# Patient Record
Sex: Female | Born: 1960 | Race: White | Hispanic: No | Marital: Single | Smoking: Never smoker
Health system: Southern US, Community
[De-identification: ages and names within clinical notes are randomized; demographics above are authoritative.]

## PROBLEM LIST (undated history)

## (undated) DIAGNOSIS — G43909 Migraine, unspecified, not intractable, without status migrainosus: Secondary | ICD-10-CM

## (undated) DIAGNOSIS — M1990 Unspecified osteoarthritis, unspecified site: Secondary | ICD-10-CM

---

## 2016-06-25 DIAGNOSIS — G43909 Migraine, unspecified, not intractable, without status migrainosus: Secondary | ICD-10-CM | POA: Insufficient documentation

## 2020-11-16 ENCOUNTER — Encounter (HOSPITAL_COMMUNITY): Payer: Self-pay

## 2020-11-16 ENCOUNTER — Emergency Department (HOSPITAL_COMMUNITY): Payer: Medicare PPO

## 2020-11-16 ENCOUNTER — Emergency Department (HOSPITAL_COMMUNITY)
Admission: EM | Admit: 2020-11-16 | Discharge: 2020-11-17 | Disposition: A | Discharge status: Home / Self Care | Payer: Medicare PPO | Attending: Emergency Medicine | Admitting: Emergency Medicine

## 2020-11-16 DIAGNOSIS — S80211A Abrasion, right knee, initial encounter: Secondary | ICD-10-CM | POA: Diagnosis not present

## 2020-11-16 DIAGNOSIS — W000XXA Fall on same level due to ice and snow, initial encounter: Secondary | ICD-10-CM | POA: Insufficient documentation

## 2020-11-16 DIAGNOSIS — S8991XA Unspecified injury of right lower leg, initial encounter: Secondary | ICD-10-CM | POA: Diagnosis present

## 2020-11-16 DIAGNOSIS — M79642 Pain in left hand: Secondary | ICD-10-CM | POA: Diagnosis not present

## 2020-11-16 IMAGING — DX DG KNEE COMPLETE 4+V*R*
4 series · 4 of 4 positions shown · non-contrast
Comparison: None.

CLINICAL DATA: Fall, pain

EXAM:
RIGHT KNEE - COMPLETE 4+ VIEW

[knee ap]
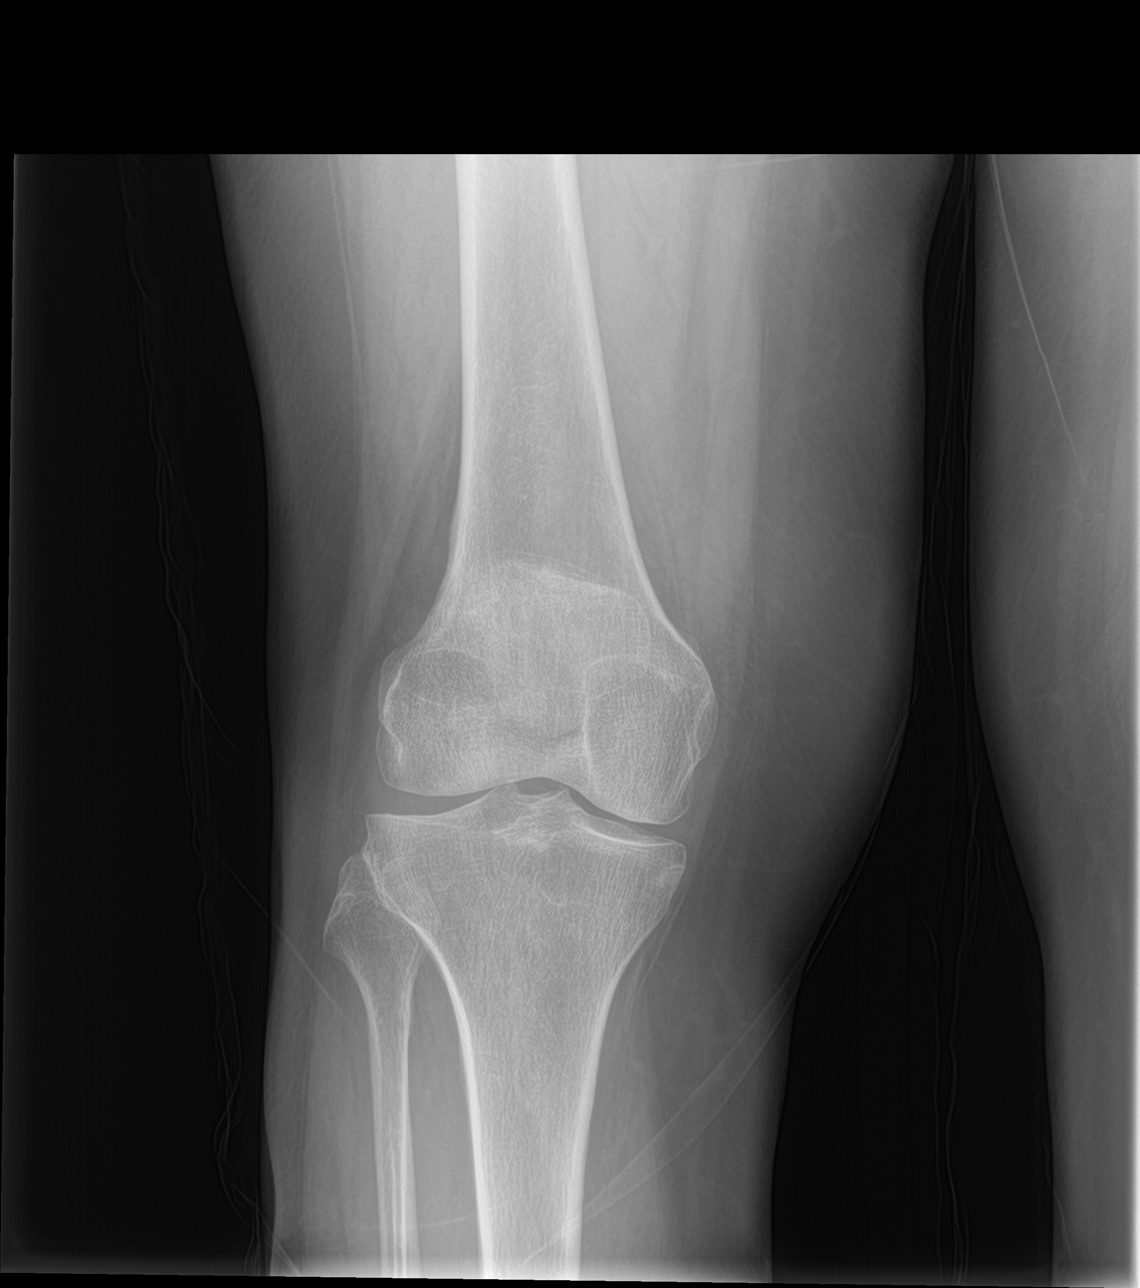

[knee lat]
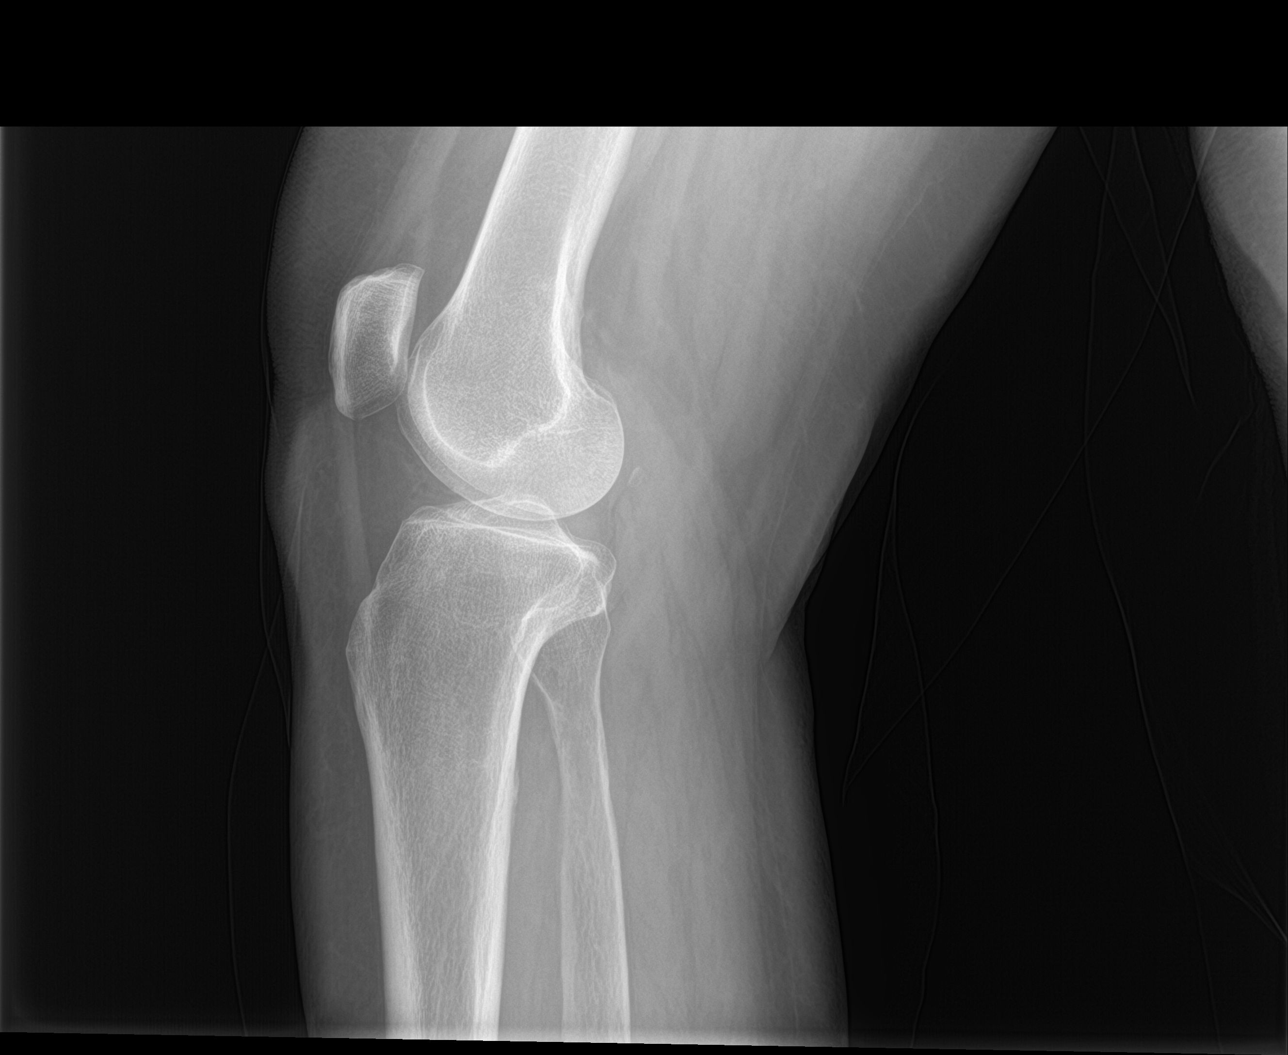

[knee obl (1 of 2)]
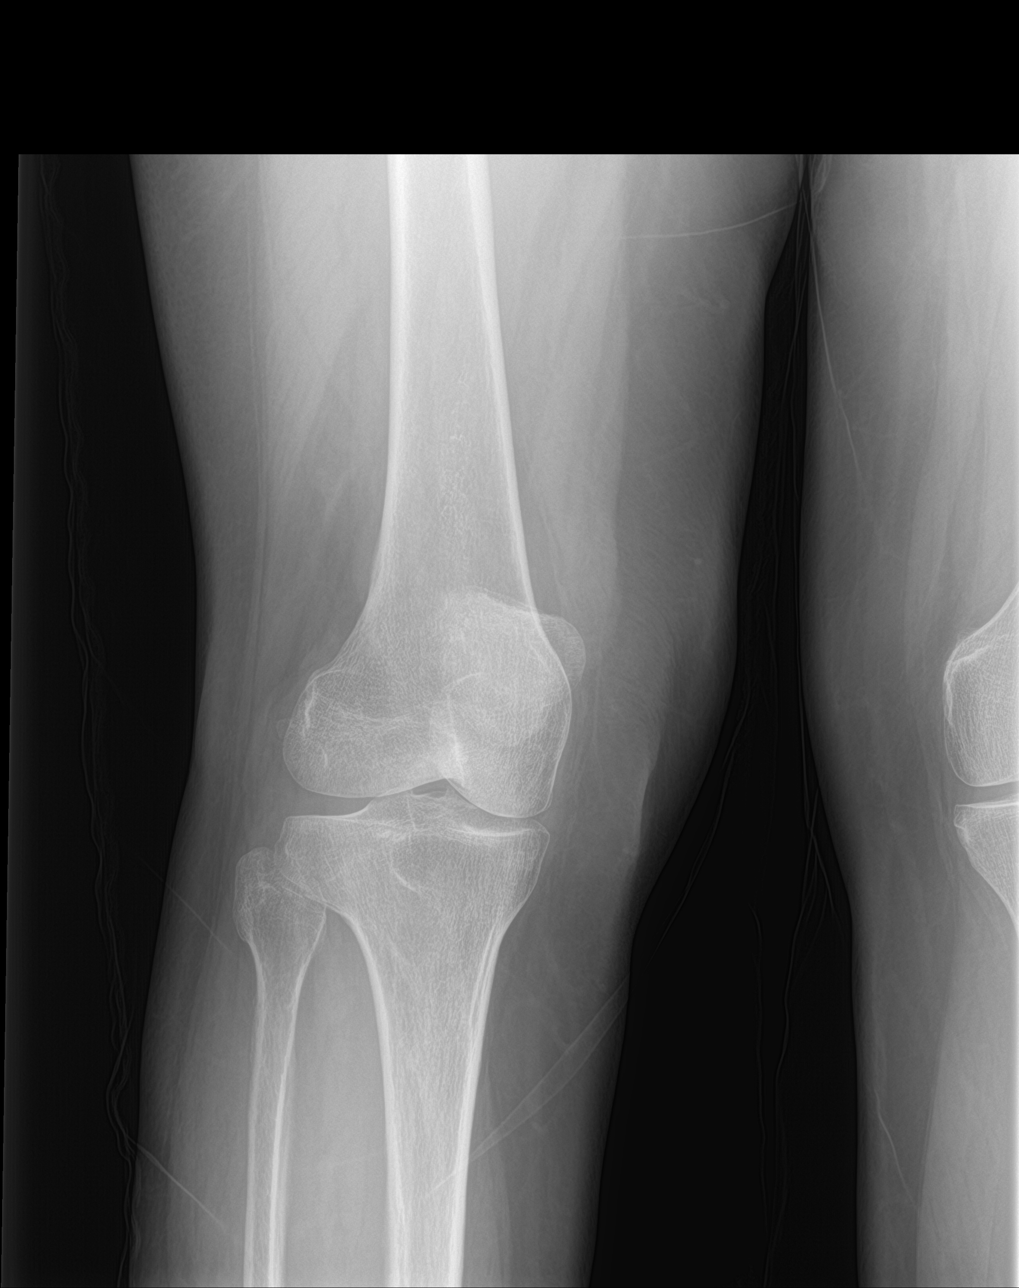

[knee obl (2 of 2)]
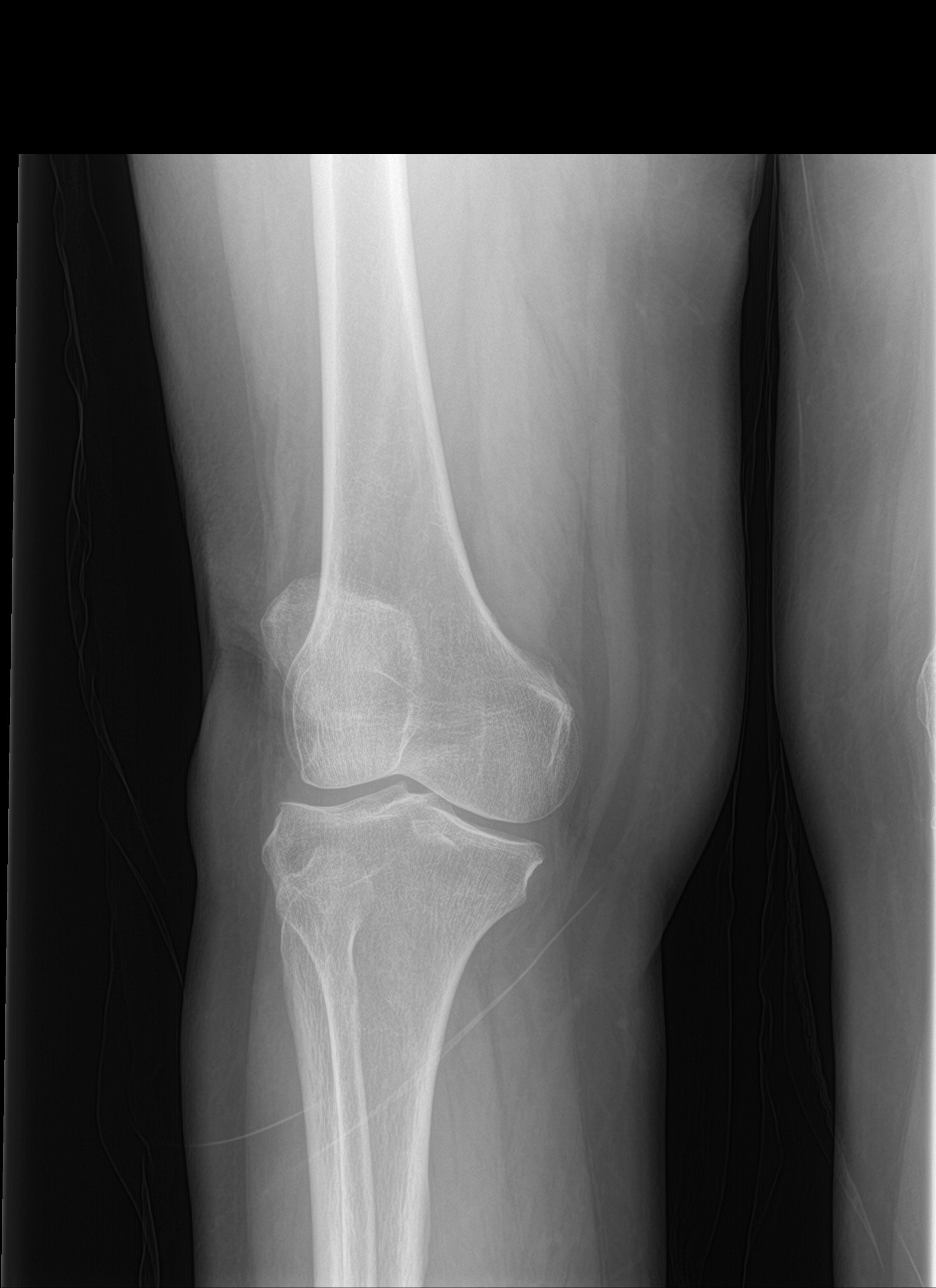

[4 of 4 positions shown; findings below may reference images not displayed]

FINDINGS: No evidence of fracture, dislocation, or joint effusion. No evidence
of arthropathy or other focal bone abnormality. Soft tissues are
unremarkable.
IMPRESSION: Negative.

## 2020-11-16 IMAGING — DX DG HAND COMPLETE 3+V*L*
3 series · 3 of 3 positions shown · non-contrast
Comparison: None.

CLINICAL DATA: Status post fall

EXAM:
LEFT HAND - COMPLETE 3+ VIEW

[hand pa]
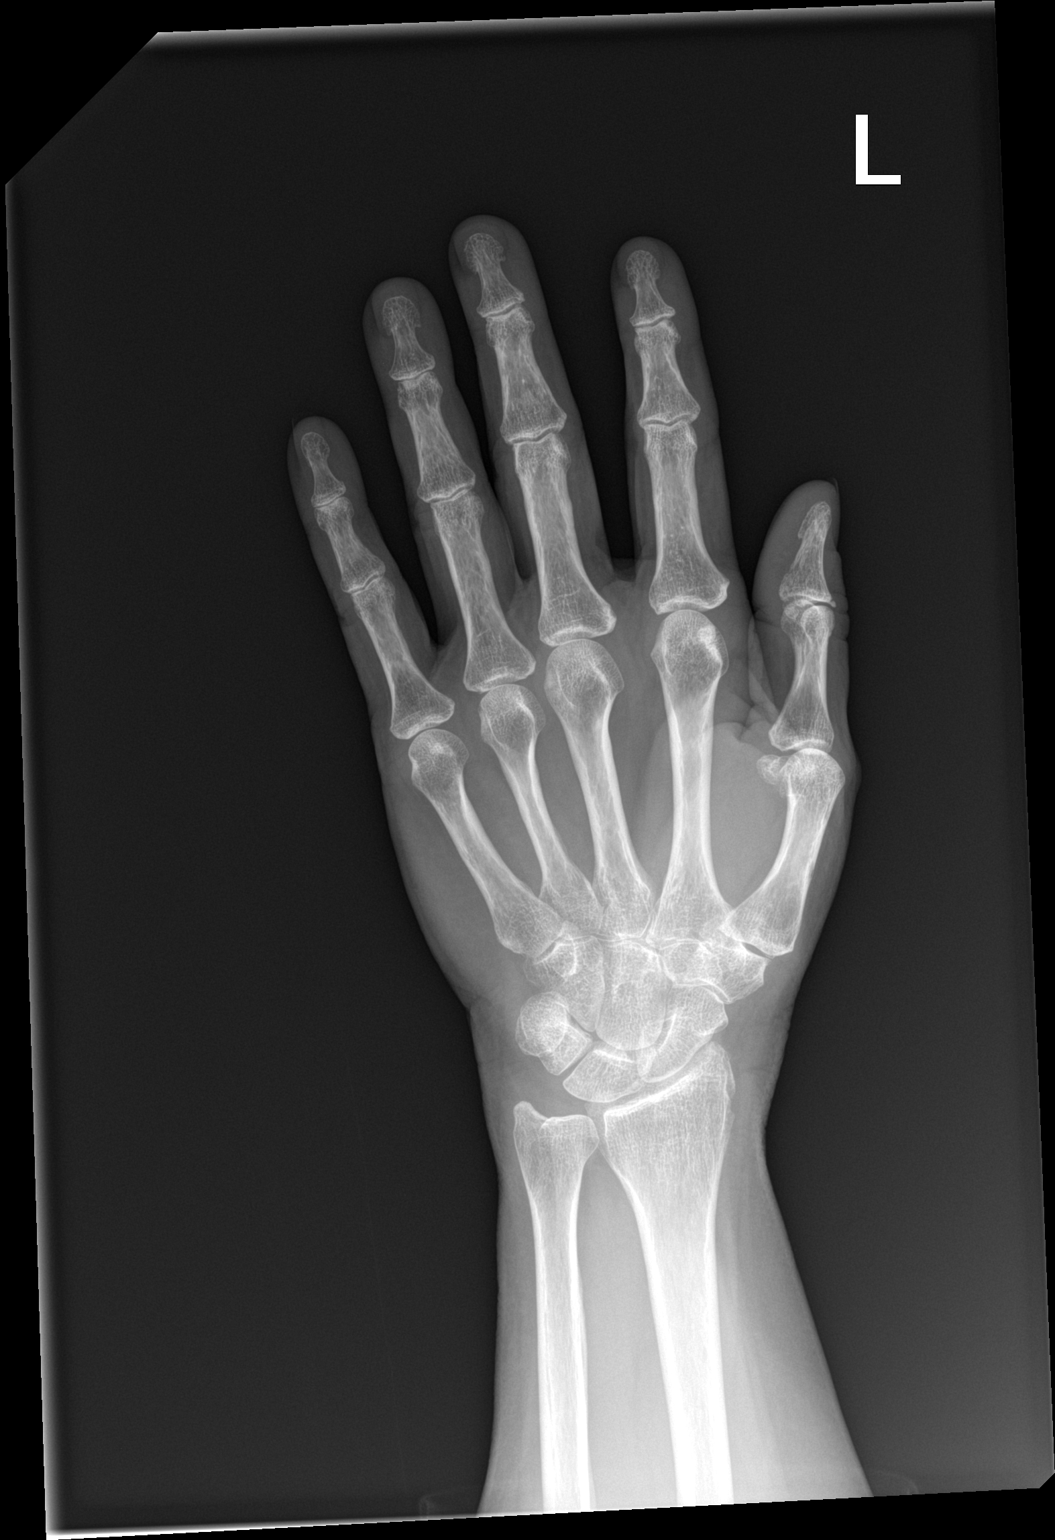

[hand obl]
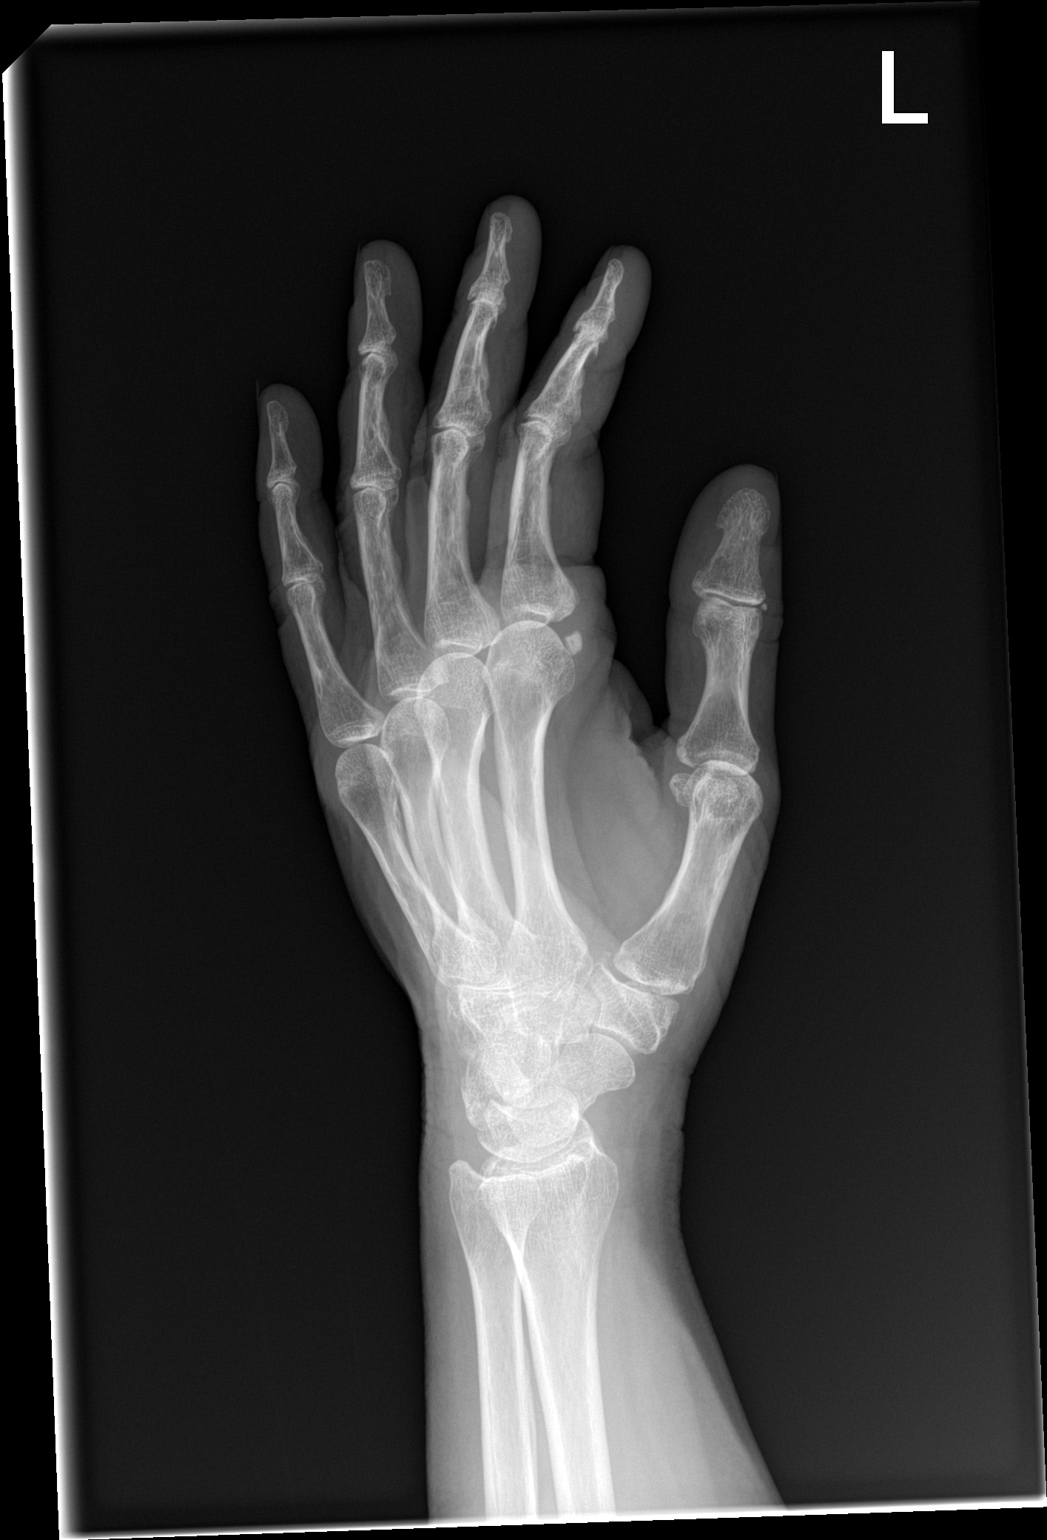

[hand lat]
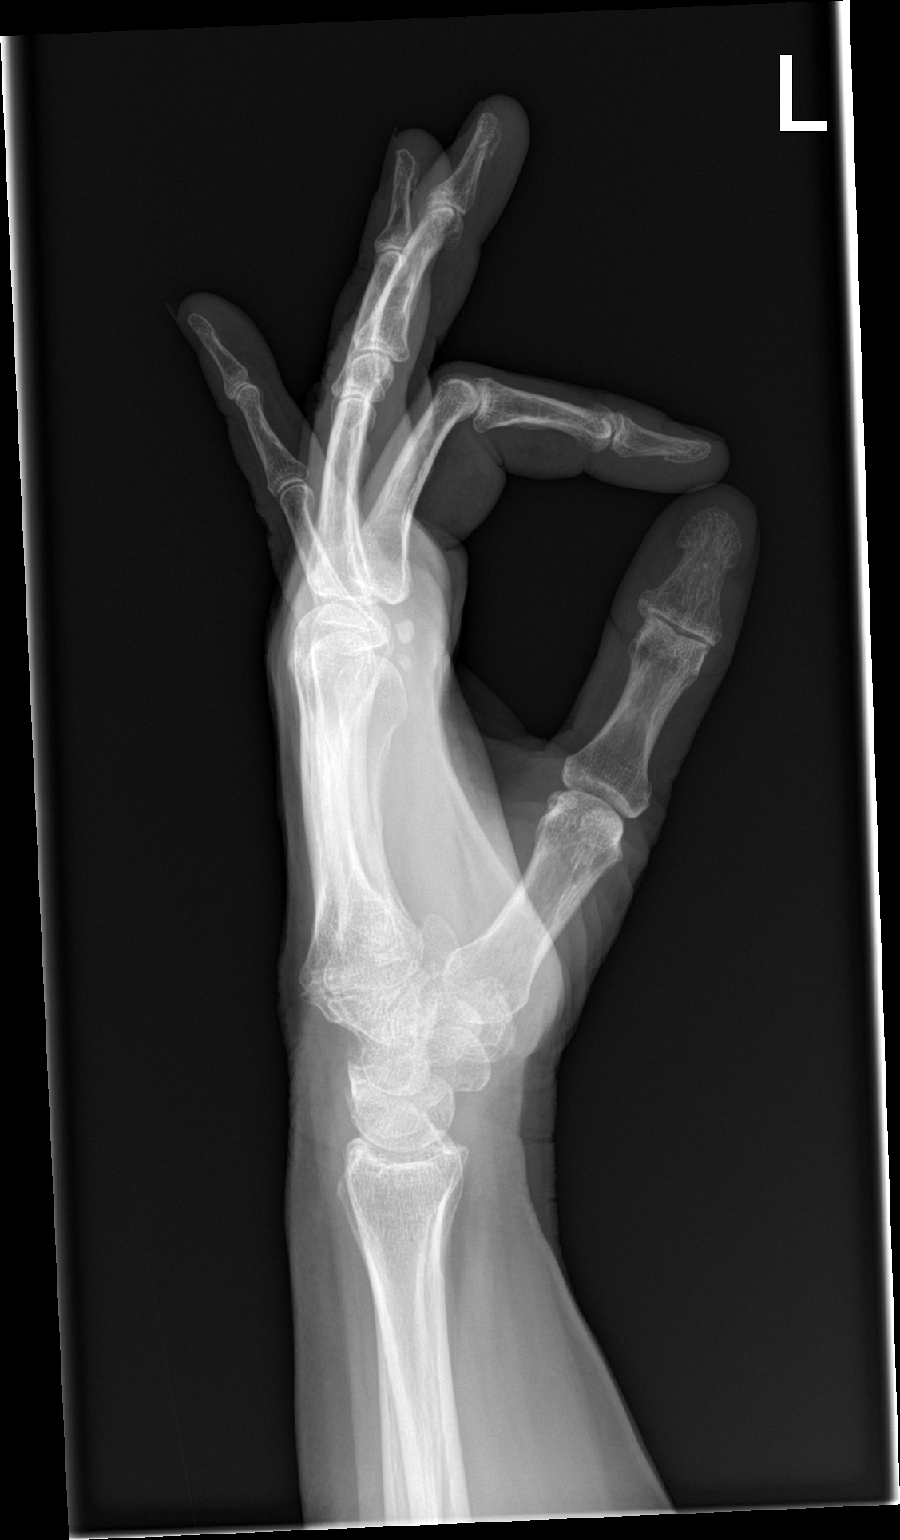

[3 of 3 positions shown; findings below may reference images not displayed]

FINDINGS: There is no evidence of fracture or dislocation. Mild
interphalangeal joint degenerative changes. Mild first
carpometacarpal joint degenerative changes. There is no evidence of
arthropathy or other focal bone abnormality. Soft tissues are
unremarkable.
IMPRESSION: No acute displaced fracture or dislocation.

## 2020-11-16 MED ORDER — OXYCODONE-ACETAMINOPHEN 5-325 MG PO TABS
1.0000 | ORAL_TABLET | Freq: Once | ORAL | Status: AC
  Administered 2020-11-16: 1 via ORAL
  Filled 2020-11-16: qty 1

## 2020-11-16 MED ORDER — KETOROLAC TROMETHAMINE 60 MG/2ML IM SOLN
30.0000 mg | Freq: Once | INTRAMUSCULAR | Status: AC
  Administered 2020-11-16: 30 mg via INTRAMUSCULAR
  Filled 2020-11-16: qty 2

## 2020-11-16 NOTE — Progress Notes (Signed)
Orthopedic Tech Progress Note Patient Details:  Aimee Bruce 13-Nov-1960 [id number]  Ortho Devices Type of Ortho Device: Thumb spica splint Splint Material: Other (comment) Ortho Device/Splint Interventions: Application   Post Interventions Patient Tolerated: Well   Aimee Bruce 11/16/2020, 11:56 PM

## 2020-11-16 NOTE — ED Triage Notes (Addendum)
Pt BIB EMS from home due to fall.Pt reports she fell on ice . Pt c/o left hand pain.Pt denies hitting head. Pt is deaf.

## 2020-11-17 NOTE — ED Provider Notes (Signed)
[facility] EMERGENCY DEPARTMENT Provider Note   CSN: [id number] Arrival date & time: 11/16/20  1444     History Chief Complaint  Patient presents with   Hand Pain    Aimee Bruce is a 60 y.o. female.  60 year old female who is deaf and speaks and language presents emerged part after a fall.  She says she is walking to the parking lot slipped on some ice fell forward hit her head and landed on her left hand which ultimately bent her thumb back at a weird angle and then also scraped her right knee.  She presents here for further evaluation.  At this time, 9 hours into her stay, and her head does not hurt.  She has mild abrasions to her right knee and still has some left wrist pain.  No injuries elsewhere.  No loss of conscious.  No vomiting.  No severe headache. No neuro changes.   A language interpreter was used.  Hand Pain       History reviewed. No pertinent past medical history.  There are no problems to display for this patient.   History reviewed. No pertinent surgical history.   OB History   No obstetric history on file.     History reviewed. No pertinent family history.  Social History   Tobacco Use   Smoking status: Never Smoker   Smokeless tobacco: Never Used    Home Medications Prior to Admission medications   Not on File    Allergies    Patient has no allergy information on record.  Review of Systems   Review of Systems  All other systems reviewed and are negative.   Physical Exam Updated Vital Signs BP 122/74    Pulse 81    Temp 98.3 F (36.8 C) (Oral)    Resp 18    SpO2 97%   Physical Exam Vitals and nursing note reviewed.  Constitutional:      Appearance: She is well-developed and well-nourished.  HENT:     Head: Normocephalic and atraumatic.     Mouth/Throat:     Mouth: Mucous membranes are moist.     Pharynx: Oropharynx is clear.  Eyes:     Pupils: Pupils are equal, round, and reactive to light.   Cardiovascular:     Rate and Rhythm: Normal rate and regular rhythm.  Pulmonary:     Effort: No respiratory distress.     Breath sounds: No stridor.  Abdominal:     General: There is no distension.  Musculoskeletal:        General: Tenderness (left snuffbox) present.     Cervical back: Normal range of motion.  Skin:    General: Skin is warm and dry.     Comments: Abrasions to right knee  Neurological:     General: No focal deficit present.     Mental Status: She is alert.     ED Results / Procedures / Treatments   Labs (all labs ordered are listed, but only abnormal results are displayed) Labs Reviewed - No data to display  EKG None  Radiology DG Knee Complete 4 Views Right  Result Date: 11/16/2020 CLINICAL DATA:  Fall, pain EXAM: RIGHT KNEE - COMPLETE 4+ VIEW COMPARISON:  None. FINDINGS: No evidence of fracture, dislocation, or joint effusion. No evidence of arthropathy or other focal bone abnormality. Soft tissues are unremarkable. IMPRESSION: Negative. Electronically Signed   By: Jonna Clark M.D.   On: 11/16/2020 19:55   DG Hand Complete Left  Result Date: 11/16/2020 CLINICAL DATA:  Status post fall EXAM: LEFT HAND - COMPLETE 3+ VIEW COMPARISON:  None. FINDINGS: There is no evidence of fracture or dislocation. Mild interphalangeal joint degenerative changes. Mild first carpometacarpal joint degenerative changes. There is no evidence of arthropathy or other focal bone abnormality. Soft tissues are unremarkable. IMPRESSION: No acute displaced fracture or dislocation. Electronically Signed   By: Tish Frederickson M.D.   On: 11/16/2020 19:55    Procedures Procedures (including critical care time)  Medications Ordered in ED Medications  ketorolac (TORADOL) injection 30 mg (30 mg Intramuscular Given 11/16/20 2332)  oxyCODONE-acetaminophen (PERCOCET/ROXICET) 5-325 MG per tablet 1 tablet (1 tablet Oral Given 11/16/20 2332)    ED Course  I have reviewed the triage vital signs  and the nursing notes.  Pertinent labs & imaging results that were available during my care of the patient were reviewed by me and considered in my medical decision making (see chart for details).    MDM Rules/Calculators/A&P                          No obvious injuries on x-rays.  She could have an occults scaphoid fracture so we will put in a thumb spica and have her follow-up with PCP for repeat imaging in a week if she still having discomfort.  Knee abrasions to be treated at home.  Low suspicion for intracranial injury secondary to no headache or neuro symptoms or other evidence of severe trauma to her head.  We will treat symptomatically here DC on Tylenol and ibuprofen.  Final Clinical Impression(s) / ED Diagnoses Final diagnoses:  Left hand pain    Rx / DC Orders ED Discharge Orders    None       Asuncion Tapscott, Barbara Cower, MD 11/17/20 [phone]

## 2021-01-01 ENCOUNTER — Ambulatory Visit: Payer: Medicare PPO | Admitting: Family Medicine

## 2021-01-08 ENCOUNTER — Ambulatory Visit: Payer: Self-pay

## 2021-01-08 ENCOUNTER — Encounter: Payer: Self-pay | Admitting: Family Medicine

## 2021-01-08 ENCOUNTER — Other Ambulatory Visit: Payer: Self-pay

## 2021-01-08 ENCOUNTER — Ambulatory Visit (INDEPENDENT_AMBULATORY_CARE_PROVIDER_SITE_OTHER): Payer: Medicare Other | Admitting: Family Medicine

## 2021-01-08 DIAGNOSIS — M545 Low back pain, unspecified: Secondary | ICD-10-CM

## 2021-01-08 DIAGNOSIS — G8929 Other chronic pain: Secondary | ICD-10-CM

## 2021-01-08 DIAGNOSIS — M25532 Pain in left wrist: Secondary | ICD-10-CM

## 2021-01-08 MED ORDER — OXYCODONE-ACETAMINOPHEN 5-325 MG PO TABS: 1.0000 | ORAL_TABLET | Freq: Three times a day (TID) | ORAL | 0 refills | Status: DC | PRN

## 2021-01-08 NOTE — Progress Notes (Signed)
I saw and examined the patient with Dr. Marga Hoots and agree with assessment and plan as outlined.    Ongoing left wrist pain more than 6 weeks since falling.  Still very tender in anatomic snuffbox and at thumb CMC.  X-Rays today are equivocal.  Will order MRI followed by hand surgery consult if fracture confirmed, for possible ORIF.  She is deaf and relies heavily on hands for communication.  Also chronic LBP.  Previously did well with injections, and requests referral for that.

## 2021-01-08 NOTE — Progress Notes (Signed)
Office Visit Note   Patient: Aimee Bruce           Date of Birth: [birth year]           MRN: [id number] Visit Date: 01/08/2021 Requested by: No referring provider defined for this encounter. PCP: Patient, No Pcp Per  Subjective: Chief Complaint  Patient presents with  . Lower Back - Pain  . Left Wrist - Pain, Follow-up    HPI: 59yo RHD F presenting to clinic with concerns of left wrist pain as well as dorsalgia. Patient states that she slipped on ice and landed on her left wrist, causing immediate, severe pain. She struggled to get up, and had to call 911 for assistance. In the hospital, she was told she 'pulled' her wrist, and was given a thumb spica splint. She continues to endorse severe pain throughout the radial aspect of her wrist, which is significantly limiting her ability to communicate with sign language. She also feels as though her wrist is swollen, and has a bony bump that isn't present on the right. She was told in the ED to follow up with her PCM for repeated Xrays, but wasn't able to do so.  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 This has done very well in controlling her symptoms, and she is interested in having a similar procedure done again if possible.                ROS:   All other systems were reviewed and are negative.  Objective: Vital Signs: There were no vitals taken for this visit.  Physical Exam:  General:  Alert and oriented, in no acute distress. Pulm:  Breathing unlabored. Psy:  Normal mood, congruent affect. Skin:  Left wrist with no bruising, rashes, or erythema. Overlying skin intact.  Left Wrist Exam:  No obvious swelling, deformity or discoloration.  ROM: Full ROm in wrist, however endorses pain with radial deviation. All fingers with full ROM.  Normal MCP/PIP/DIP joint  flexion and extension of all fingers. No finger rotational abnormality.   Strength: No pain with resisted extension or flexion of the wrist. Dos endorse pain with resisted pronation/supination, as well as resisted abduction of thumb.  Normal Grip strength.  Normal finger abduction and adduction, and normal flexor superficialis (PIP) and Profundus strength (DIP).   Palpation: Endorses significant Snuff Box tenderness. There is also tenderness over the [facility] Joint, and along the first dorsal wrist compartment, but this is less significant than over snuff box.  No pain with compression over the lunate and triquetrum. No pain with palpation in TFCC area. No pain over distal radius or ulna.   Carpal Special Tests:  Watson's Test (Scaphoid shift test): Endorses pain, though no obvious click at scaphoid with radial deviation Reagan Test (Luno-triquetral ballotment test): No pain or clicking Schuck Test: No pain or increased laxity within carpals  DeQuervain's Tenosynovitis- Finklestein's test does cause pain.   Sensation intact throughout all fingers Brisk capillary refill.   Back Exam: Normal gait, normal spinal curvature. Does have tenderness throughout paraspinal muscles throughout the thoracic and lumbar spine, as well as peri-scapular trigger points within the rhomboids/inferior traps.   Imaging: No results found.  Assessment & Plan: 60yo F presenting to clinic with concerns of left wrist pain after a fall approximately 6 weeks ago. Patient continues to have  significant pain in her left scaphoid, and xrays today with questionable callous formation, possibly indicative of a small fracture within the scaphoid.  - Given questionable Xrays, will order MRI to better evaluate wrist pain. - Continue to wear thumb spica splint while awaiting MRI results - If positive, would place referral to hand team  Per her back, given her success with spinal injections previously, will place referral to Dr  Alvester Morin for consideration of the same.   - Patient expresses understanding with plan. She has no further questions or concerns today.      Procedures: No procedures performed        PMFS History: There are no problems to display for this patient.  History reviewed. No pertinent past medical history.  History reviewed. No pertinent family history.  History reviewed. No pertinent surgical history. Social History   Occupational History  . Not on file  Tobacco Use  . Smoking status: Never Smoker  . Smokeless tobacco: Never Used  Substance and Sexual Activity  . Alcohol use: Not on file  . Drug use: Not on file  . Sexual activity: Not on file

## 2021-01-15 ENCOUNTER — Other Ambulatory Visit: Payer: Self-pay

## 2021-01-15 ENCOUNTER — Ambulatory Visit (INDEPENDENT_AMBULATORY_CARE_PROVIDER_SITE_OTHER): Payer: Medicare Other | Admitting: Family Medicine

## 2021-01-15 ENCOUNTER — Encounter: Payer: Self-pay | Admitting: Family Medicine

## 2021-01-15 DIAGNOSIS — M545 Low back pain, unspecified: Secondary | ICD-10-CM

## 2021-01-15 DIAGNOSIS — M25561 Pain in right knee: Secondary | ICD-10-CM | POA: Diagnosis not present

## 2021-01-15 DIAGNOSIS — G8929 Other chronic pain: Secondary | ICD-10-CM

## 2021-01-15 DIAGNOSIS — G894 Chronic pain syndrome: Secondary | ICD-10-CM

## 2021-01-15 NOTE — Patient Instructions (Signed)
   For arthritis:   Glucosamine Sulfate 1,000 mg twice daily  Turmeric:  500 mg twice daily    

## 2021-01-15 NOTE — Progress Notes (Signed)
Office Visit Note   Patient: Aimee Bruce           Date of Birth: 05-Sep-1961           MRN: [id number] Visit Date: 01/15/2021 Requested by: No referring provider defined for this encounter. PCP: Patient, No Pcp Per  Subjective: Chief Complaint  Patient presents with  . Right Knee - Pain    Intermittent lateral knee pain x 1 year. NKI. Hurts with driving and getting in/out of the car. The patient feels a "knot" in that area. The knee aches at night - hurts with moving it at night. Had xrays in January. She said she would like to have a "full body" MRI. She has an MRI of her left wrist coming up on 02/02/21 at [facility].     HPI: 60 year old female presenting to clinic with concerns of chronic right knee pain.  Patient states her right knee has been bothering her off and on for about the last 2 years, however it acutely worsened 2 months ago when she slipped and fell directly on the front of her knee.  She was seen in the ER, where x-rays were negative for fractures, but she was told that she had mild arthritis.  Since this time she endorses pain with turning on the leg, specifically getting in and out of her car, and feels as though it will occasionally swell.  No catching or locking, no feelings of instability.  She is curious to know if an injection might help improve her pain.  She takes Percocet for other musculoskeletal concerns, and says that this also improved her knee pain.  She says she has a strong family history of bilateral knee replacements and her mother, and is very worried about falling and this footsteps.  She denies doing any regular exercises to strengthen her knee.  Sign language interpreter present throughout visit to assist with communication.              ROS:   All other systems were reviewed and are negative.  Objective: Vital Signs: There were no vitals taken for this visit.  Physical Exam:  General:  Alert and oriented, in no acute distress. Pulm:   Breathing unlabored. Psy:  Normal mood, congruent affect. Skin: Bilateral knees with no bruising, rashes, or erythema.  Overlying skin intact.  Right knee exam:  General: Normal gait Standing exam: No varus or valgus deformity of the knee.   Seated Exam:  No significant crepitus, Negative J-Sign.   Palpation: Endorses significant tenderness palpation over both medial and lateral joint lines bilaterally, right worse than left.  Also endorses tenderness with patellar compression on the right, as well as within the patellar tendon.  Tenderness to palpation of both medial and lateral patellar facets on the right.  Tenderness within the posterior aspect of the knee, though no obvious fullness appreciated.   Supine exam: No effusion, normal patellar mobility.   Ligamentous Exam:  Endorses pain but no laxity with anterior/posterior drawer.  No obvious Sag.  Endorses pain though has strong endpoints with varus/valgus stress across the knee.   Meniscus:  McMurray with no pain or deep clicking.   Strength: Hip flexion (L1), Hip Aduction (L2), Knee Extension (L3) are 5/5 Bilaterally Foot Inversion (L4), Dorsiflexion (L5), and Eversion (S1) 5/5 Bilaterally  Sensation: Intact to light touch medial and lateral aspects of lower extremities, and lateral, dorsal, and medial aspects of foot.    Imaging: No results found.  Assessment & Plan:  60 year old female presenting to clinic today with chronic right knee pain, acutely worsened after a fall 2 months ago.  Examination with tenderness throughout the right knee, though no obvious effusions appreciated.  X-rays from the emergency department reviewed, which do demonstrate very mild degenerative changes, which may be contributing to her symptoms.  Patient would like to try injection therapy today.  Risks and benefits discussed, and patient opted to proceed. -Injection therapy described as below.  Patient tolerated this very well, with no acute  complications.  She endorsed immediate improvement of symptoms during the anesthetic phase. -Patient requests refill of Percocet, we discussed that this is not a safe long-term option for her pain control.  Will place referral to pain management for consideration of chronic opiate therapy or, more preferably, other means of controlling her symptoms. -Discussed home exercises for knee stabilization and strengthening. -Patient expressed understanding with plan.  She no further questions or concerns today.     Procedures: Right knee cortisone Injection:  Risks and benefits of procedure discussed, Patient opted to proceed.  Verbal consent obtained.  Timeout performed.  Skin prepped in a sterile fashion with betadine before further cleansing with alcohol. Ethyl Chloride was used for topical analgesia.  Right knee was injected with 4cc 0.25% bupivacaine without epinephrine via the midpatellar approach using a 25G, 1.5in needle. Syringe was removed from the needle, and 6mg  betamethasone was then injected into the joint.    Patient tolerated the injection well with no immediate complications. Aftercare instructions were discussed, and patient was given strict return precautions.      PMFS History: Patient Active Problem List   Diagnosis Date Noted  . Migraine 06/25/2016   History reviewed. No pertinent past medical history.  History reviewed. No pertinent family history.  History reviewed. No pertinent surgical history. Social History   Occupational History  . Not on file  Tobacco Use  . Smoking status: Never Smoker  . Smokeless tobacco: Never Used  Substance and Sexual Activity  . Alcohol use: Not on file  . Drug use: Not on file  . Sexual activity: Not on file

## 2021-01-15 NOTE — Progress Notes (Signed)
I saw and examined the patient with Dr. Marga Hoots and agree with assessment and plan as outlined.    Chronic right knee pain with mild arthritis on x-ray.  Injection given today.  Also home exercises.  Suggested glucosamine, turmeric, avoidance of sugar, and weight loss.  Patient wants percocet long-term.  Will request pain clinic referral.

## 2021-02-01 ENCOUNTER — Encounter: Payer: Self-pay | Admitting: Physical Medicine & Rehabilitation

## 2021-02-02 ENCOUNTER — Other Ambulatory Visit: Payer: Self-pay

## 2021-02-02 ENCOUNTER — Ambulatory Visit
Admission: RE | Admit: 2021-02-02 | Discharge: 2021-02-02 | Disposition: A | Discharge status: Home / Self Care | Payer: Medicare Other | Source: Ambulatory Visit | Attending: Family Medicine | Admitting: Family Medicine

## 2021-02-02 DIAGNOSIS — M25532 Pain in left wrist: Secondary | ICD-10-CM

## 2021-02-02 IMAGING — MR MR WRIST*L* W/O CM
9 series · 40 of 40 positions shown · non-contrast
Comparison: None.

CLINICAL DATA: Left wrist pain. Status post fall.

EXAM:
MR OF THE LEFT WRIST WITHOUT CONTRAST
TECHNIQUE: Multiplanar, multisequence MR imaging of the left wrist was
performed. No intravenous contrast was administered.

[Series 3: T2 fat-sat · axial · left · 3.0mm · 0.31mm/px · z∈[-48,+38]mm · 7 of 28 slices shown (1 of 2)]
[im 1/28]
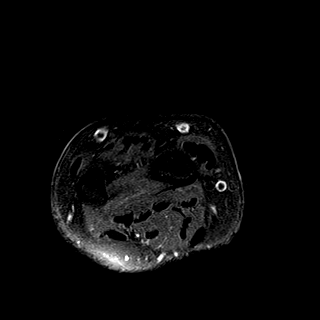
[im 5/28]
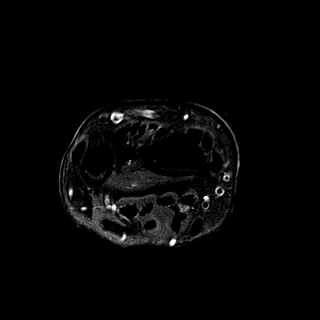
[im 10/28]
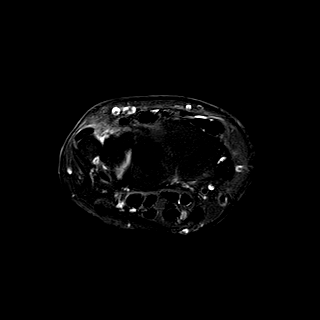
[im 14/28]
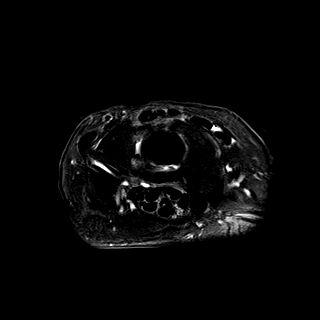
[im 19/28]
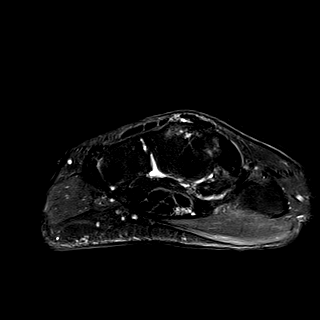
[im 23/28]
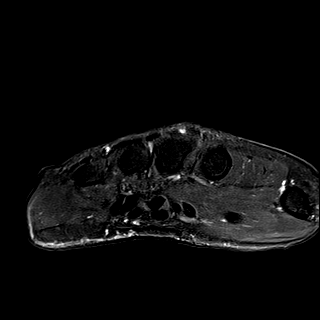
[im 28/28]
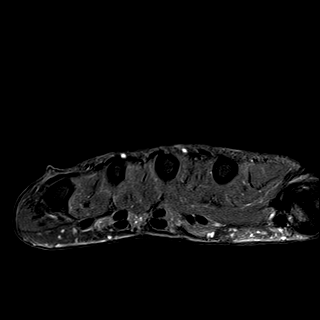

[Series 4: T1 · axial · left · 3.0mm · 0.31mm/px · z∈[-48,+38]mm · 6 of 28 slices shown (1 of 2)]
[im 1/28]
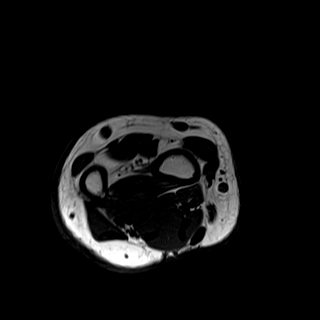
[im 6/28]
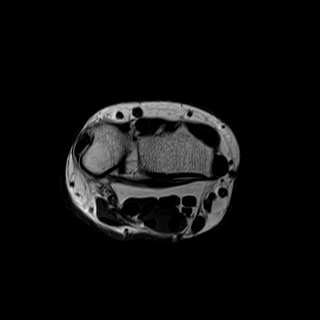
[im 11/28]
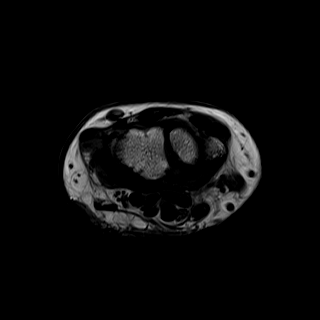
[im 17/28]
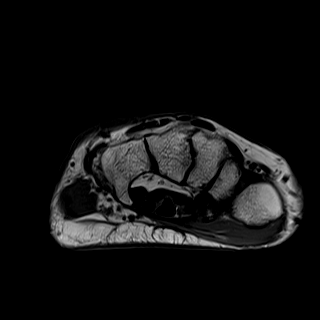
[im 22/28]
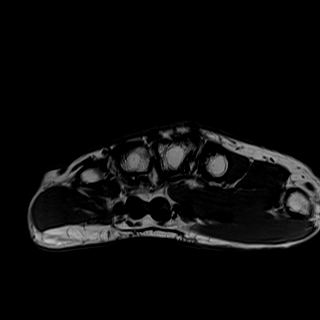
[im 28/28]
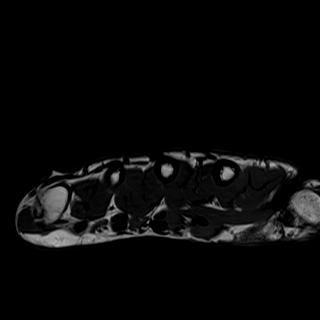

[Series 5: T1 · coronal · left · 3.0mm · 0.31mm/px · 3 of 13 slices shown (2 of 2)]
[im 1/13]
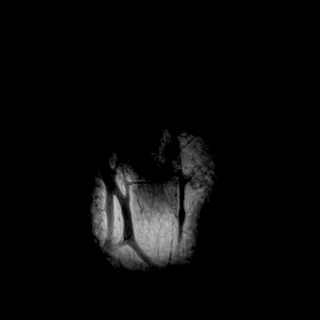
[im 7/13]
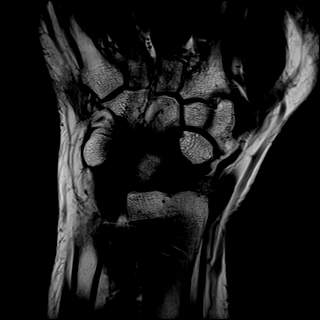
[im 13/13]
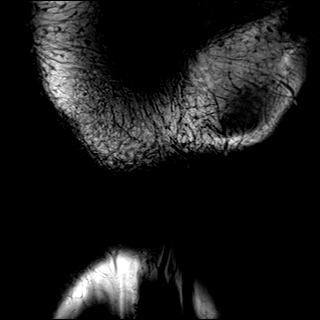

[Series 6: PD · axial · left · 3.0mm · 0.31mm/px · z∈[-48,+38]mm · 6 of 28 slices shown]
[im 1/28]
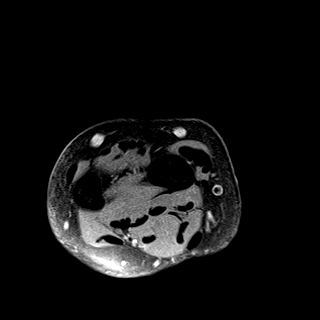
[im 6/28]
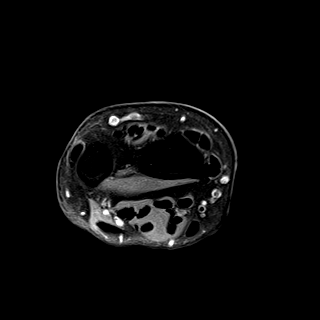
[im 11/28]
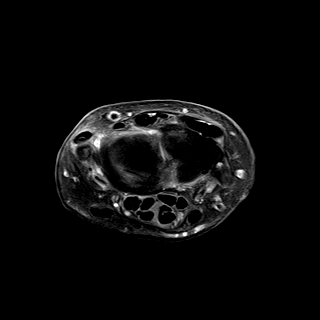
[im 17/28]
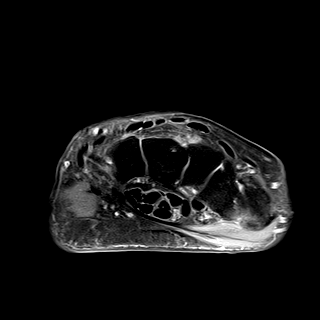
[im 22/28]
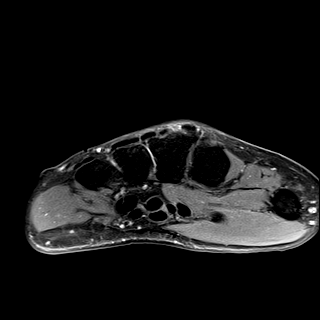
[im 28/28]
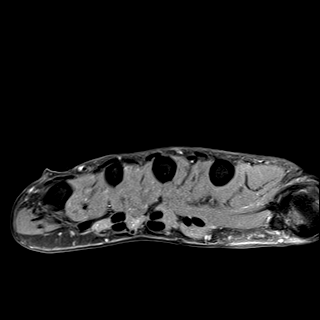

[Series 7: T2 fat-sat · coronal · left · 3.0mm · 0.31mm/px · 3 of 13 slices shown (2 of 2)]
[im 1/13]
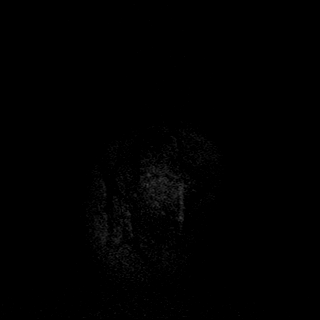
[im 7/13]
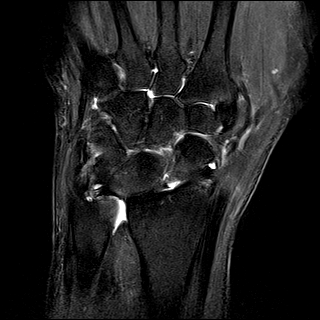
[im 13/13]
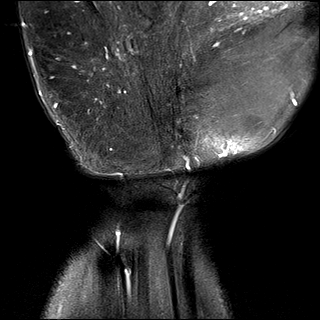

[Series 8: PD fat-sat · coronal · left · 3.0mm · 0.31mm/px · 3 of 13 slices shown (1 of 2)]
[im 1/13]
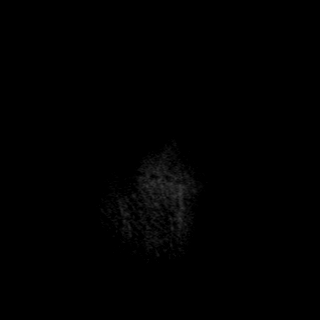
[im 7/13]
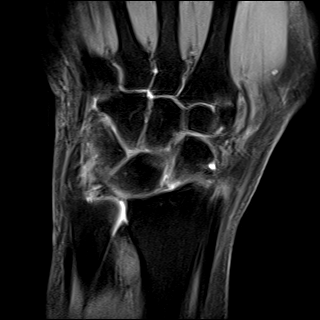
[im 13/13]
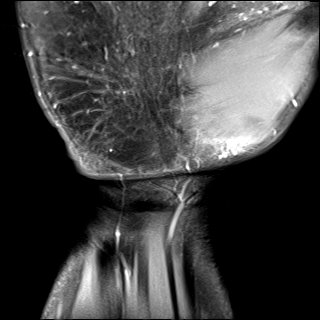

[Series 9: PD fat-sat · sagittal · left · 3.0mm · 0.44mm/px · 6 of 24 slices shown (2 of 2)]
[im 1/24]
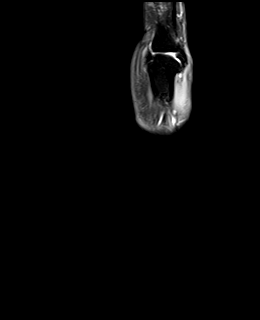
[im 5/24]
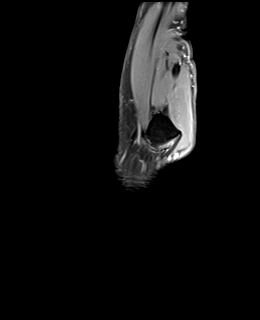
[im 10/24]
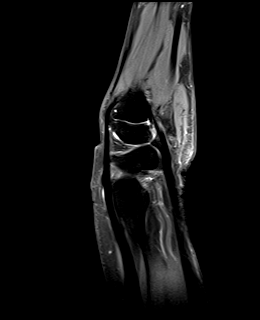
[im 14/24]
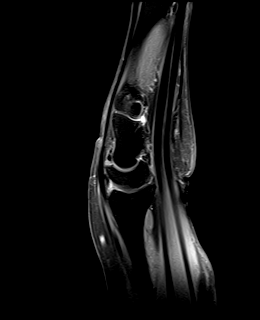
[im 19/24]
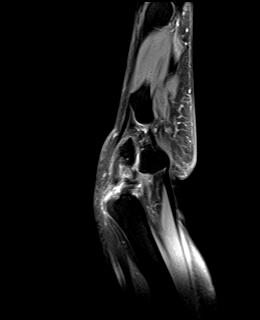
[im 24/24]
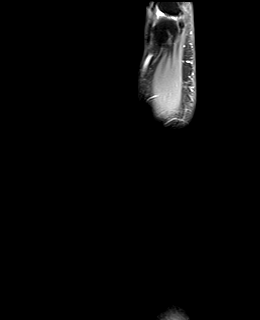

[Series 10: T2 · coronal · left · 3.0mm · 0.31mm/px · 3 of 14 slices shown]
[im 1/14]
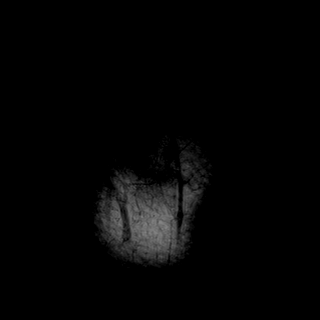
[im 7/14]
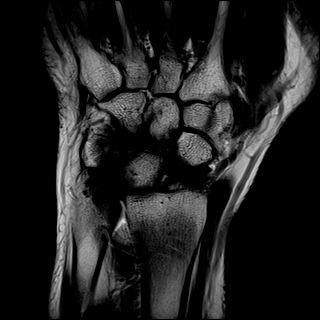
[im 14/14]
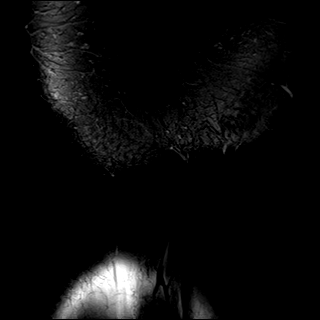

[Series 11: STIR · coronal · left · 3.0mm · 0.31mm/px · 3 of 12 slices shown]
[im 1/12]
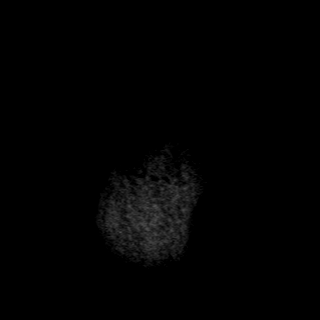
[im 6/12]
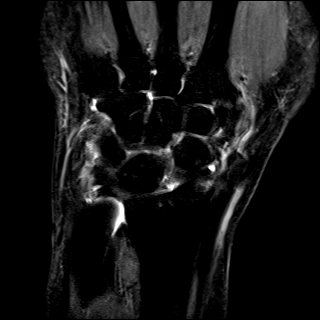
[im 12/12]
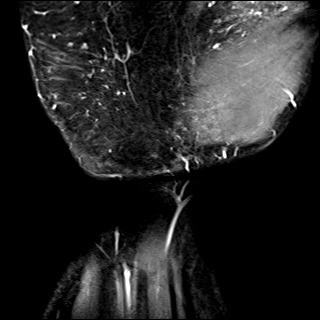

[40 of 40 positions shown; findings below may reference images not displayed]

FINDINGS: Ligaments: Extrinsic ligaments are intact. Scapholunate and
lunotriquetral ligaments are intact.

Triangular fibrocartilage: Degeneration of the TFCC with severe
thinning of the body of the TFCC without a discrete tear.

Tendons: Intact flexor compartment tendons. Moderate tendinosis of
the extensor carpi ulnaris tendon. Remainder the extensor
compartment tendons are intact.

Carpal tunnel/median nerve: Flexor retinaculum is intact. Normal
carpal tunnel without a mass. Median nerve demonstrates normal
signal and caliber.

Guyon's canal: Normal Guyon's canal. Normal ulnar nerve.

Joint/cartilage: Small distal radioulnar joint effusion. Small wrist
joint effusion. Partial-thickness cartilage loss of the
scaphotrapeziotrapezoid joint. High-grade partial-thickness
cartilage loss of the first CMC joint with subchondral reactive
marrow edema in the trapezium.

Bones/carpal alignment: No fracture, avascular necrosis, or osseous
lesion. Normal alignment. Osseous prominence of the dorsal base of
the third metacarpal and adjacent hamate and capitate with
subcortical cystic changes as can be seen with [REDACTED].

Other: Muscles are normal. No fluid collection, hematoma, or soft
tissue mass.
IMPRESSION: 1. No acute osseous injury of the left wrist.
2. Moderate tendinosis of the extensor carpi ulnaris tendon.
3. Partial-thickness cartilage loss of the scaphotrapeziotrapezoid
joint.
4. High-grade partial-thickness cartilage loss of the first CMC
joint with subchondral reactive marrow edema in the trapezium.
5. Osseous prominence of the dorsal base of the third metacarpal and
adjacent hamate and capitate with subcortical cystic changes as can
be seen with [REDACTED].

## 2021-02-04 ENCOUNTER — Telehealth: Payer: Self-pay | Admitting: Family Medicine

## 2021-02-04 DIAGNOSIS — M25532 Pain in left wrist: Secondary | ICD-10-CM

## 2021-02-04 NOTE — Telephone Encounter (Signed)
Wrist MRI does not show a ligament tear or broken bone.  No indication for surgery at this point.  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  Will make referral to PT and Hand for therapy.

## 2021-02-06 NOTE — Telephone Encounter (Signed)
Printed off the MRI report and this message from Dr. Prince Rome. The patient is coming in to see Dr. Alvester Morin tomorrow morning. Will give this to her then.

## 2021-02-07 ENCOUNTER — Ambulatory Visit: Payer: Medicare Other | Admitting: Physical Medicine and Rehabilitation

## 2021-02-16 ENCOUNTER — Ambulatory Visit: Payer: Medicare Other | Admitting: Physical Medicine and Rehabilitation

## 2021-02-16 ENCOUNTER — Encounter: Payer: Self-pay | Admitting: Physical Medicine and Rehabilitation

## 2021-02-16 ENCOUNTER — Other Ambulatory Visit: Payer: Self-pay

## 2021-02-16 VITALS — BP 123/79 | HR 87

## 2021-02-16 DIAGNOSIS — G8929 Other chronic pain: Secondary | ICD-10-CM

## 2021-02-16 DIAGNOSIS — Z981 Arthrodesis status: Secondary | ICD-10-CM

## 2021-02-16 DIAGNOSIS — M5136 Other intervertebral disc degeneration, lumbar region: Secondary | ICD-10-CM

## 2021-02-16 DIAGNOSIS — M546 Pain in thoracic spine: Secondary | ICD-10-CM | POA: Diagnosis not present

## 2021-02-16 DIAGNOSIS — M545 Low back pain, unspecified: Secondary | ICD-10-CM | POA: Diagnosis not present

## 2021-02-16 DIAGNOSIS — M5134 Other intervertebral disc degeneration, thoracic region: Secondary | ICD-10-CM

## 2021-02-16 DIAGNOSIS — G894 Chronic pain syndrome: Secondary | ICD-10-CM

## 2021-02-16 NOTE — Progress Notes (Signed)
Pt state lower back pain. Pt state standing and sitting for long period pf tie makes the pain worse. Pt state she take pain meds to help ease her pain.  Numeric Pain Rating Scale and Functional Assessment Average Pain 9 Pain Right Now 5 My pain is intermittent and aching Pain is worse with: sitting, standing and some activites Pain improves with: medication   In the last MONTH (on 0-10 scale) has pain interfered with the following?  1. General activity like being  able to carry out your everyday physical activities such as walking, climbing stairs, carrying groceries, or moving a chair?  Rating(9)  2. Relation with others like being able to carry out your usual social activities and roles such as  activities at home, at work and in your community. Rating(9)  3. Enjoyment of life such that you have  been bothered by emotional problems such as feeling anxious, depressed or irritable?  Rating(9)

## 2021-03-02 ENCOUNTER — Encounter: Payer: Self-pay | Admitting: Physical Medicine & Rehabilitation

## 2021-03-02 ENCOUNTER — Other Ambulatory Visit: Payer: Self-pay

## 2021-03-02 ENCOUNTER — Encounter: Payer: Medicare Other | Attending: Physical Medicine & Rehabilitation | Admitting: Physical Medicine & Rehabilitation

## 2021-03-02 VITALS — BP 130/90 | HR 99 | Temp 97.9°F | Ht 67.0 in | Wt 225.8 lb

## 2021-03-02 DIAGNOSIS — M797 Fibromyalgia: Secondary | ICD-10-CM | POA: Insufficient documentation

## 2021-03-02 MED ORDER — DULOXETINE HCL 30 MG PO CPEP: 30.0000 mg | ORAL_CAPSULE | Freq: Every day | ORAL | 1 refills | Status: DC

## 2021-03-02 NOTE — Progress Notes (Signed)
Subjective:    Patient ID: Aimee Bruce, female    DOB: Apr 09, 1961, 60 y.o.   MRN: [id number] Patient accompanied by sign language interpreter HPI Pulling trailer 1 week ago.   CC: Widespread body pain 60 year old female referred by orthopedics for the evaluation of chronic pain at the knee and back.  Symptoms have been chronic but some exacerbation after pulling a 600 pound trailer last week.  She had no change in her function.  Remains independent with all self-care and mobility.  Did not seek ED visit.  Last seen by Ortho/PMNR/sports medicine 2 weeks ago.  Past history significant for cervical fusion performed in [facility] [place] in 2012.  The patient has no radiating pain down the arms.  No falls.  The patient's pain on her diagram is in bilateral neck and shoulder area the entire back right hip bilateral knees and left foot.  Pain scores as below.  Sleep is poor.  Pain is worse with walking bending sitting standing improves with rest medications and injections.  She walks without an assistive device.  Her walking tolerance is 2 hours.  She drives.  She is disabled due to severe hearing impairment.  The patient takes Tylenol 3 times a day.  She does no exercise.  No PT,  No DC Tried arthritis tylenol  Mod I with Bathing and dressing  Mod I with housework Reviewed knee x-rays right knee 11/16/2020, no abnormalities, left wrist x-rays 11/16/2020 showed degenerative changes right carpometacarpal joint. MRI of the left wrist showed triangular fibrocartilage degeneration, prominent base of third metacarpal, first CMC joint degenerative changes Pain Inventory Average Pain 9 Pain Right Now 9 My pain is sharp, burning, stabbing, tingling and aching  In the last 24 hours, has pain interfered with the following? General activity 10 Relation with others 10 Enjoyment of life 5 What TIME of day is your pain at its worst? morning , daytime, evening and night Sleep (in general) Poor  Pain is  worse with: walking, bending, sitting, standing and some activites Pain improves with: rest, medication and injections Relief from Meds: 10  walk without assistance how many minutes can you walk? 2 hrs ability to climb steps?  no do you drive?  yes  disabled: date disabled .  hearing imapired  bowel control problems weakness numbness spasms depression anxiety  Any changes since last visit?  no  Orthopedist Lavada Mesi referring MD    History reviewed. No pertinent family history. Social History   Socioeconomic History  . Marital status: Single    Spouse name: Not on file  . Number of children: Not on file  . Years of education: Not on file  . Highest education level: Not on file  Occupational History  . Not on file  Tobacco Use  . Smoking status: Never Smoker  . Smokeless tobacco: Never Used  Substance and Sexual Activity  . Alcohol use: Not on file  . Drug use: Not on file  . Sexual activity: Not on file  Other Topics Concern  . Not on file  Social History Narrative  . Not on file   Social Determinants of Health   Financial Resource Strain: Not on file  Food Insecurity: Not on file  Transportation Needs: Not on file  Physical Activity: Not on file  Stress: Not on file  Social Connections: Not on file   History reviewed. No pertinent surgical history. History reviewed. No pertinent past medical history. BP 130/90   Pulse 99   Temp  97.9 F (36.6 C)   Ht 5\' 7"  (1.702 m)   Wt 225 lb 12.8 oz (102.4 kg)   SpO2 95%   BMI 35.37 kg/m   Opioid Risk Score:   Fall Risk Score:  `1  Depression screen PHQ 2/9  Depression screen PHQ 2/9 03/02/2021  Decreased Interest 3  Down, Depressed, Hopeless 3  PHQ - 2 Score 6  Altered sleeping 3  Tired, decreased energy 3  Change in appetite 2  Feeling bad or failure about yourself  3  Trouble concentrating 1  Moving slowly or fidgety/restless 2  Suicidal thoughts 0  PHQ-9 Score 20    Review of Systems   Constitutional: Positive for diaphoresis and unexpected weight change.       Wt gain  Eyes: Negative.   Respiratory: Negative.   Cardiovascular: Negative.   Gastrointestinal: Positive for abdominal pain and diarrhea.  Endocrine: Negative.   Genitourinary: Positive for dysuria.  Musculoskeletal: Positive for back pain and neck pain.  Skin: Negative.   Allergic/Immunologic: Negative.   Neurological: Positive for weakness and numbness.  Hematological: Negative.   Psychiatric/Behavioral: Positive for dysphoric mood. The patient is nervous/anxious.   All other systems reviewed and are negative.      Objective:   Physical Exam Vitals and nursing note reviewed.  Constitutional:      General: She is not in acute distress.    Appearance: She is obese. She is not ill-appearing.  HENT:     Head: Normocephalic and atraumatic.  Eyes:     Extraocular Movements: Extraocular movements intact.     Conjunctiva/sclera: Conjunctivae normal.     Pupils: Pupils are equal, round, and reactive to light.  Cardiovascular:     Rate and Rhythm: Normal rate and regular rhythm.     Pulses: Normal pulses.     Heart sounds: Normal heart sounds. No murmur heard.   Pulmonary:     Effort: Pulmonary effort is normal. No respiratory distress.     Breath sounds: Normal breath sounds. No wheezing.  Abdominal:     General: Abdomen is flat. Bowel sounds are normal. There is no distension.     Palpations: Abdomen is soft.     Tenderness: There is no abdominal tenderness.  Musculoskeletal:     Cervical back: Normal range of motion.  Neurological:     Mental Status: She is alert.  Psychiatric:        Mood and Affect: Mood normal.   Motor strength is 5/5 bilateral deltoid, bicep, tricep, grip, hip flexor, knee extensor, ankle dorsiflexor and plantar flexor Sensation intact to light touch and pinprick bilateral C6-C7-C8 L3-L4-L5 S1 dermatome distribution Deep tendon reflexes 2+ bilateral knees trace  bilateral ankles Negative straight leg raising bilaterally Negative Faber's bilaterally Negative thigh thrust bilaterally There is tenderness palpation bilateral upper trapezius bilateral upper medial scapular border bilateral infraspinatus area bilateral lumbosacral junction bilateral hip trochanter No evidence of joint deformity Hands without evidence of synovitis.  Wrists without evidence of synovitis Full hand and wrist range of motion full elbow shoulder hip knee and ankle range of motion. Cervical spine is 50% range of motion flexion extension.        Assessment & Plan:  #1.  Widespread body pain with hypersensitive to touch in multiple periscapular and paraspinal areas as well as over the hip.  Trochanters.  Findings most consistent with myofascial/fibromyalgia pain.  We discussed the pain sent hypersensitivity and the difference in treatment compared to other types of pain.  Recommend  outpatient physical therapy, trial of duloxetine 30 mg/day return in 6 weeks if still not controlled would consider going up to 60 mg/day.  Patient is already on gabapentin which she can continue.  Do not think oxycodone needed in this situation. Patient was requesting MRI I do not think 1 is needed at this time given normal neurologic exam.  Will reevaluate at next visit.

## 2021-03-02 NOTE — Patient Instructions (Signed)
Myofascial Pain Syndrome and Fibromyalgia Myofascial pain syndrome and fibromyalgia are both pain disorders. This pain may be felt mainly in your muscles.  Myofascial pain syndrome: ? Always has tender points in the muscle that will cause pain when pressed (trigger points). The pain may come and go. ? Usually affects your neck, upper back, and shoulder areas. The pain often radiates into your arms and hands.  Fibromyalgia: ? Has muscle pains and tenderness that come and go. ? Is often associated with fatigue and sleep problems. ? Has trigger points. ? Tends to be long-lasting (chronic), but is not life-threatening. Fibromyalgia and myofascial pain syndrome are not the same. However, they often occur together. If you have both conditions, each can make the other worse. Both are common and can cause enough pain and fatigue to make day-to-day activities difficult. Both can be hard to diagnose because their symptoms are common in many other conditions. What are the causes? The exact causes of these conditions are not known. What increases the risk? You are more likely to develop this condition if:  You have a family history of the condition.  You have certain triggers, such as: ? Spine disorders. ? An injury (trauma) or other physical stressors. ? Being under a lot of stress. ? Medical conditions such as osteoarthritis, rheumatoid arthritis, or lupus. What are the signs or symptoms? Fibromyalgia The main symptom of fibromyalgia is widespread pain and tenderness in your muscles. Pain is sometimes described as stabbing, shooting, or burning. You may also have:  Tingling or numbness.  Sleep problems and fatigue.  Problems with attention and concentration (fibro fog). Other symptoms may include:  Bowel and bladder problems.  Headaches.  Visual problems.  Problems with odors and noises.  Depression or mood changes.  Painful menstrual periods (dysmenorrhea).  Dry skin or  eyes. These symptoms can vary over time. Myofascial pain syndrome Symptoms of myofascial pain syndrome include:  Tight, ropy bands of muscle.  Uncomfortable sensations in muscle areas. These may include aching, cramping, burning, numbness, tingling, and weakness.  Difficulty moving certain parts of the body freely (poor range of motion). How is this diagnosed? This condition may be diagnosed by your symptoms and medical history. You will also have a physical exam. In general:  Fibromyalgia is diagnosed if you have pain, fatigue, and other symptoms for more than 3 months, and symptoms cannot be explained by another condition.  Myofascial pain syndrome is diagnosed if you have trigger points in your muscles, and those trigger points are tender and cause pain elsewhere in your body (referred pain). How is this treated? Treatment for these conditions depends on the type that you have.  For fibromyalgia: ? Pain medicines, such as NSAIDs. ? Medicines for treating depression. ? Medicines for treating seizures. ? Medicines that relax the muscles.  For myofascial pain: ? Pain medicines, such as NSAIDs. ? Cooling and stretching of muscles. ? Trigger point injections. ? Sound wave (ultrasound) treatments to stimulate muscles. Treating these conditions often requires a team of health care providers. These may include:  Your primary care provider.  Physical therapist.  Complementary health care providers, such as massage therapists or acupuncturists.  Psychiatrist for cognitive behavioral therapy.   Follow these instructions at home: Medicines  Take over-the-counter and prescription medicines only as told by your health care provider.  Do not drive or use heavy machinery while taking prescription pain medicine.  If you are taking prescription pain medicine, take actions to prevent or treat constipation. Your   health care provider may recommend that you: ? Drink enough fluid to keep  your urine pale yellow. ? Eat foods that are high in fiber, such as fresh fruits and vegetables, whole grains, and beans. ? Limit foods that are high in fat and processed sugars, such as fried or sweet foods. ? Take an over-the-counter or prescription medicine for constipation. Lifestyle  Exercise as directed by your health care provider or physical therapist.  Practice relaxation techniques to control your stress. You may want to try: ? Biofeedback. ? Visual imagery. ? Hypnosis. ? Muscle relaxation. ? Yoga. ? Meditation.  Maintain a healthy lifestyle. This includes eating a healthy diet and getting enough sleep.  Do not use any products that contain nicotine or tobacco, such as cigarettes and e-cigarettes. If you need help quitting, ask your health care provider.   General instructions  Talk to your health care provider about complementary treatments, such as acupuncture or massage.  Consider joining a support group with others who are diagnosed with this condition.  Do not do activities that stress or strain your muscles. This includes repetitive motions and heavy lifting.  Keep all follow-up visits as told by your health care provider. This is important. Where to find more information  National Fibromyalgia Association: www.fmaware.org  Arthritis Foundation: www.arthritis.org  American Chronic Pain Association: www.theacpa.org Contact a health care provider if:  You have new symptoms.  Your symptoms get worse or your pain is severe.  You have side effects from your medicines.  You have trouble sleeping.  Your condition is causing depression or anxiety. Summary  Myofascial pain syndrome and fibromyalgia are pain disorders.  Myofascial pain syndrome has tender points in the muscle that will cause pain when pressed (trigger points). Fibromyalgia also has muscle pains and tenderness that come and go, but this condition is often associated with fatigue and sleep  disturbances.  Fibromyalgia and myofascial pain syndrome are not the same but often occur together, causing pain and fatigue that make day-to-day activities difficult.  Treatment for fibromyalgia includes taking medicines to relax the muscles and medicines for pain, depression, or seizures. Treatment for myofascial pain syndrome includes taking medicines for pain, cooling and stretching of muscles, and injecting medicines into trigger points.  Follow your health care provider's instructions for taking medicines and maintaining a healthy lifestyle. This information is not intended to replace advice given to you by your health care provider. Make sure you discuss any questions you have with your health care provider. Document Revised: 02/05/2019 Document Reviewed: 10/29/2017 Elsevier Patient Education  2021 Elsevier Inc.  

## 2021-04-12 ENCOUNTER — Ambulatory Visit: Payer: Medicare Other | Admitting: Physical Medicine & Rehabilitation

## 2021-04-19 ENCOUNTER — Encounter (HOSPITAL_COMMUNITY): Payer: Self-pay

## 2021-04-19 ENCOUNTER — Other Ambulatory Visit: Payer: Self-pay

## 2021-04-19 ENCOUNTER — Ambulatory Visit (HOSPITAL_COMMUNITY)
Admission: EM | Admit: 2021-04-19 | Discharge: 2021-04-19 | Disposition: A | Discharge status: Home / Self Care | Payer: Medicare Other | Attending: Emergency Medicine | Admitting: Emergency Medicine

## 2021-04-19 DIAGNOSIS — H43391 Other vitreous opacities, right eye: Secondary | ICD-10-CM

## 2021-04-19 DIAGNOSIS — M546 Pain in thoracic spine: Secondary | ICD-10-CM

## 2021-04-19 DIAGNOSIS — M545 Low back pain, unspecified: Secondary | ICD-10-CM

## 2021-04-19 DIAGNOSIS — G8929 Other chronic pain: Secondary | ICD-10-CM | POA: Diagnosis not present

## 2021-04-19 HISTORY — DX: Migraine, unspecified, not intractable, without status migrainosus: G43.909

## 2021-04-19 HISTORY — DX: Unspecified osteoarthritis, unspecified site: M19.90

## 2021-04-19 MED ORDER — MELOXICAM 7.5 MG PO TABS: 7.5000 mg | ORAL_TABLET | Freq: Every day | ORAL | 1 refills | Status: DC

## 2021-04-19 NOTE — ED Provider Notes (Signed)
[facility]    CSN: [id number] Arrival date & time: 04/19/21  1356      History   Chief Complaint Chief Complaint  Patient presents with   Eye Problem   Back Pain   Abdominal Pain    HPI Aimee Bruce is a 60 y.o. female.   Patient presents with 3 constant black floating spots that move in the right eye, present for 4 months. Intermittent blurred vision. Denies eye pain, discharge, redness, light sensitivity, eye swelling, headaches. Wears reading glasses. Last seen eye doctor in 2012.   Concerned with chronic mid back pain present for 10 years related to osteoarthritis. Having numbness and tingling radiating to right middle finger for the 2 weeks. ROM intact. Followed by orthopedics. Discontinued narcotic and started duloxetine in beginning of May. PT referral placed. Took duloxetine 3 days before self discontinuing. Says " he got my diagnosis wrong". Per patient has attempted to reach out to physician with no response. Has upcoming appointment in July. Has been using tylenol with no relief.   ASL interpreter used for entirety of exam   Past Medical History:  Diagnosis Date   Arthritis    Migraine     Patient Active Problem List   Diagnosis Date Noted   Migraine 06/25/2016    History reviewed. No pertinent surgical history.  OB History   No obstetric history on file.      Home Medications    Prior to Admission medications   Medication Sig Start Date End Date Taking? Authorizing Provider  meloxicam (MOBIC) 7.5 MG tablet Take 1 tablet (7.5 mg total) by mouth daily. 04/19/21  Yes Lianny Molter R, NP  amphetamine-dextroamphetamine (ADDERALL) 30 MG tablet daily.    [provider]  DULoxetine (CYMBALTA) 30 MG capsule Take 1 capsule (30 mg total) by mouth daily. 03/02/21   Kirsteins, Victorino Sparrow, MD  gabapentin (NEURONTIN) 100 MG capsule Take 100 mg by mouth 3 (three) times daily. 01/05/21   [provider]  gabapentin (NEURONTIN) 300 MG capsule  Take 300 mg by mouth 3 (three) times daily. 11/01/20   [provider]  omeprazole (PRILOSEC) 40 MG capsule Take 40 mg by mouth daily. 11/03/20   [provider]  oxyCODONE-acetaminophen (PERCOCET/ROXICET) 5-325 MG tablet Take 1 tablet by mouth every 8 (eight) hours as needed for severe pain. Patient not taking: Reported on 03/02/2021 01/08/21   Hilts, Casimiro Needle, MD  SUMAtriptan (IMITREX) 25 MG tablet Take by mouth at bedtime. 12/06/20   [provider]  zolpidem (AMBIEN) 10 MG tablet Take 10 mg by mouth at bedtime as needed. 12/06/20   [provider]    Family History Family History  Problem Relation Age of Onset   Rheum arthritis Mother    Hypertension Mother    Healthy Father     Social History Social History   Tobacco Use   Smoking status: Never   Smokeless tobacco: Never  Vaping Use   Vaping Use: Never used  Substance Use Topics   Alcohol use: Yes   Drug use: Never     Allergies   Patient has no known allergies.   Review of Systems Review of Systems  Constitutional: Negative.   Eyes:  Positive for visual disturbance. Negative for photophobia, pain, discharge, redness and itching.  Respiratory: Negative.    Cardiovascular: Negative.   Musculoskeletal:  Positive for back pain. Negative for arthralgias, gait problem, joint swelling, myalgias, neck pain and neck stiffness.  Skin: Negative.  Physical Exam Triage Vital Signs ED Triage Vitals  Enc Vitals Group     BP 04/19/21 1514 (!) 142/94     Pulse Rate 04/19/21 1514 82     Resp 04/19/21 1514 18     Temp 04/19/21 1514 99.3 F (37.4 C)     Temp Source 04/19/21 1514 Oral     SpO2 04/19/21 1514 99 %     Weight --      Height --      Head Circumference --      Peak Flow --      Pain Score 04/19/21 1508 8     Pain Loc --      Pain Edu? --      Excl. in GC? --    No data found.  Updated Vital Signs BP (!) 142/94 (BP Location: Right Arm)   Pulse 82   Temp 99.3 F (37.4 C)  (Oral)   Resp 18   SpO2 99%   Visual Acuity Right Eye Distance:   Left Eye Distance:   Bilateral Distance:    Right Eye Near:   Left Eye Near:    Bilateral Near:     Physical Exam Constitutional:      Appearance: She is well-developed and normal weight.  HENT:     Head: Normocephalic.  Eyes:     General: Lids are normal. Lids are everted, no foreign bodies appreciated. Vision grossly intact.        Right eye: No discharge or hordeolum.     Extraocular Movements: Extraocular movements intact.     Conjunctiva/sclera: Conjunctivae normal.     Pupils: Pupils are equal, round, and reactive to light.  Pulmonary:     Effort: Pulmonary effort is normal.  Musculoskeletal:     Thoracic back: Tenderness present. No swelling or spasms. Normal range of motion.     Lumbar back: Tenderness present. No spasms. Normal range of motion.       Back:  Skin:    General: Skin is dry.  Neurological:     Mental Status: She is alert and oriented to person, place, and time. Mental status is at baseline.  Psychiatric:        Mood and Affect: Mood normal.        Behavior: Behavior normal.     UC Treatments / Results  Labs (all labs ordered are listed, but only abnormal results are displayed) Labs Reviewed - No data to display  EKG   Radiology No results found.  Procedures Procedures (including critical care time)  Medications Ordered in UC Medications - No data to display  Initial Impression / Assessment and Plan / UC Course  I have reviewed the triage vital signs and the nursing notes.  Pertinent labs & imaging results that were available during my care of the patient were reviewed by me and considered in my medical decision making (see chart for details).  Chronic mid thoracic back pain Chronic bilateral low back pain without sciatica Floaters in visual field  Unable to address complaint of abdominal pain, patient upset and eft before end of appointment. patient frustrated  with inability to diagnosis and treat floaters of right eye, no overt abnormality seen on exam, advised ophthalmology follow up, given resource, patient not wanting to see another physician, upset " that I can't use the equipment here to help her" , discussed capabilities of specialist compared to urgent care scope. Prescribed daily meloxicam 7.5 mg for pain management, encouraged to go to  July appointment, given resources for two orthopedic specialist because unhappy with current specialist. Discussed long term management of back pain and capabilities of specialist vs urgent care.  Final Clinical Impressions(s) / UC Diagnoses   Final diagnoses:  Chronic midline thoracic back pain  Chronic bilateral low back pain without sciatica  Floaters in visual field, right     Discharge Instructions      You may follow up with one of the orthopedic practices or another practice of your choice for management of your arthritis  May follow up with opthalmologist listed below or practice of your choice to evaluate eyes   Can take meloxicam once daily for pain   ED Prescriptions     Medication Sig Dispense Auth. Provider   meloxicam (MOBIC) 7.5 MG tablet Take 1 tablet (7.5 mg total) by mouth daily. 30 tablet Valinda Hoar, NP      PDMP not reviewed this encounter.   Valinda Hoar, [place] 04/19/21 [phone]

## 2021-04-19 NOTE — ED Triage Notes (Signed)
Pt reports black seeing black spots in the right  eye x 4 months. Denies headache, eye pain.   Pt reports left upper quadrant abdominal pain. States she fell and hit the abdomen with her elbow 8 years ago, pain is being increasing with the years. States pain is consistent "no matter what I do".   Pt reports middle back pain on and off  x 10 years due to arthritis. States having numbness in the right middle finger x 2 weeks. States this mus be connected to the back pain.  Tylenol gives no relief.

## 2021-04-19 NOTE — Discharge Instructions (Addendum)
You may follow up with one of the orthopedic practices or another practice of your choice for management of your arthritis  May follow up with opthalmologist listed below or practice of your choice to evaluate eyes   Can take meloxicam once daily for pain

## 2021-04-20 ENCOUNTER — Other Ambulatory Visit: Payer: Self-pay

## 2021-04-20 ENCOUNTER — Emergency Department (HOSPITAL_COMMUNITY): Payer: Medicare Other

## 2021-04-20 ENCOUNTER — Emergency Department (HOSPITAL_COMMUNITY)
Admission: EM | Admit: 2021-04-20 | Discharge: 2021-04-20 | Disposition: A | Discharge status: Home / Self Care | Payer: Medicare Other | Attending: Emergency Medicine | Admitting: Emergency Medicine

## 2021-04-20 DIAGNOSIS — R0789 Other chest pain: Secondary | ICD-10-CM | POA: Diagnosis not present

## 2021-04-20 DIAGNOSIS — R55 Syncope and collapse: Secondary | ICD-10-CM | POA: Diagnosis not present

## 2021-04-20 DIAGNOSIS — W1812XA Fall from or off toilet with subsequent striking against object, initial encounter: Secondary | ICD-10-CM | POA: Diagnosis not present

## 2021-04-20 DIAGNOSIS — M545 Low back pain, unspecified: Secondary | ICD-10-CM | POA: Diagnosis not present

## 2021-04-20 DIAGNOSIS — S0990XA Unspecified injury of head, initial encounter: Secondary | ICD-10-CM | POA: Diagnosis present

## 2021-04-20 DIAGNOSIS — M542 Cervicalgia: Secondary | ICD-10-CM | POA: Insufficient documentation

## 2021-04-20 DIAGNOSIS — S0083XA Contusion of other part of head, initial encounter: Secondary | ICD-10-CM | POA: Insufficient documentation

## 2021-04-20 LAB — COMPREHENSIVE METABOLIC PANEL
ALT: 27 U/L (ref 0–44)
AST: 31 U/L (ref 15–41)
Albumin: 4 g/dL (ref 3.5–5.0)
Alkaline Phosphatase: 77 U/L (ref 38–126)
Anion gap: 8 (ref 5–15)
BUN: 17 mg/dL (ref 6–20)
CO2: 26 mmol/L (ref 22–32)
Calcium: 9.5 mg/dL (ref 8.9–10.3)
Chloride: 105 mmol/L (ref 98–111)
Creatinine, Ser: 1.08 mg/dL — ABNORMAL HIGH (ref 0.44–1.00)
GFR, Estimated: 60 mL/min — ABNORMAL LOW (ref >60)
Glucose, Bld: 156 mg/dL — ABNORMAL HIGH (ref 70–99)
Potassium: 4.1 mmol/L (ref 3.5–5.1)
Sodium: 139 mmol/L (ref 135–145)
Total Bilirubin: 0.6 mg/dL (ref 0.3–1.2)
Total Protein: 7 g/dL (ref 6.5–8.1)

## 2021-04-20 LAB — CBC WITH DIFFERENTIAL/PLATELET
Abs Immature Granulocytes: 0.04 10*3/uL (ref 0.00–0.07)
Basophils Absolute: 0.1 10*3/uL (ref 0.0–0.1)
Basophils Relative: 1 %
Eosinophils Absolute: 0.1 10*3/uL (ref 0.0–0.5)
Eosinophils Relative: 1 %
HCT: 43 % (ref 36.0–46.0)
Hemoglobin: 14.1 g/dL (ref 12.0–15.0)
Immature Granulocytes: 0 %
Lymphocytes Relative: 18 %
Lymphs Abs: 1.7 10*3/uL (ref 0.7–4.0)
MCH: 29.1 pg (ref 26.0–34.0)
MCHC: 32.8 g/dL (ref 30.0–36.0)
MCV: 88.8 fL (ref 80.0–100.0)
Monocytes Absolute: 0.6 10*3/uL (ref 0.1–1.0)
Monocytes Relative: 6 %
Neutro Abs: 7.2 10*3/uL (ref 1.7–7.7)
Neutrophils Relative %: 74 %
Platelets: 257 10*3/uL (ref 150–400)
RBC: 4.84 MIL/uL (ref 3.87–5.11)
RDW: 13.1 % (ref 11.5–15.5)
WBC: 9.7 10*3/uL (ref 4.0–10.5)
nRBC: 0 % (ref 0.0–0.2)

## 2021-04-20 LAB — TROPONIN I (HIGH SENSITIVITY): Troponin I (High Sensitivity): 3 ng/L (ref <18)

## 2021-04-20 IMAGING — CT CT CERVICAL SPINE W/O CM
4 series · 14 of 33 positions shown, 16 images · non-contrast
Comparison: None.

CLINICAL DATA: Fall trauma

EXAM:
CT HEAD WITHOUT CONTRAST
CT CERVICAL SPINE WITHOUT CONTRAST
TECHNIQUE: Multidetector CT imaging of the head and cervical spine was
performed following the standard protocol without intravenous
contrast. Multiplanar CT image reconstructions of the cervical spine
were also generated.

[Series 5: c spine soft · axial · 0.36mm/px · z∈[-288,-254]mm · 2 of 119 slices shown]
[im 17/119  soft-tissue]
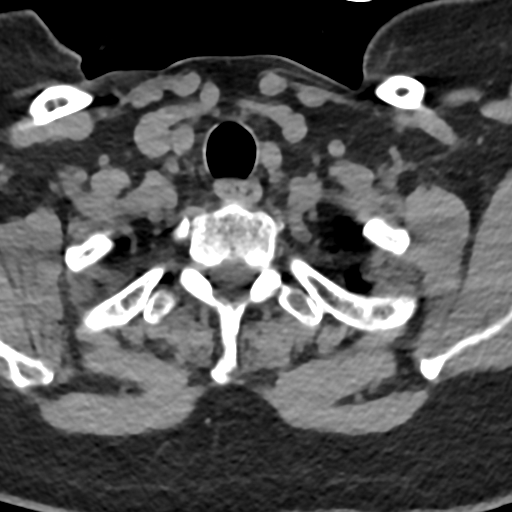
[im 34/119  soft-tissue]
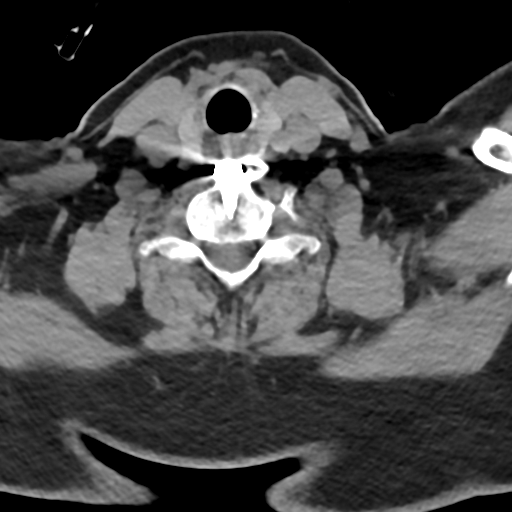

[Series 8: sag bone · sagittal · 0.33mm/px · 5 of 83 slices shown, 6 images]
[im 28/83  bone]
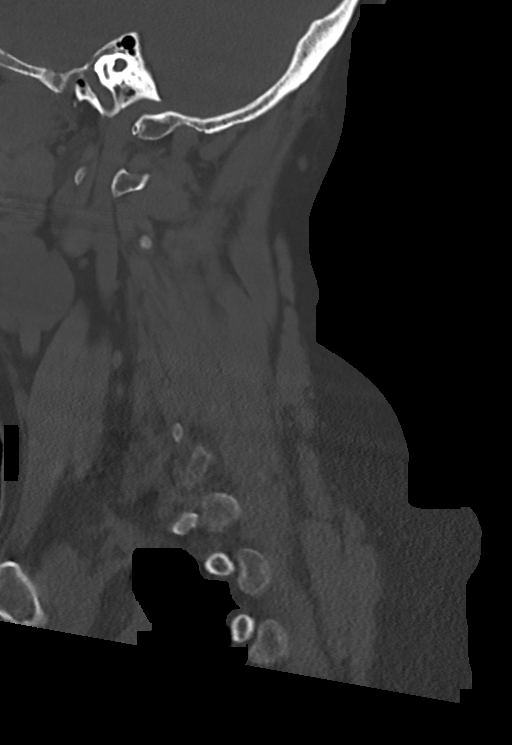
[im 35/83  bone]
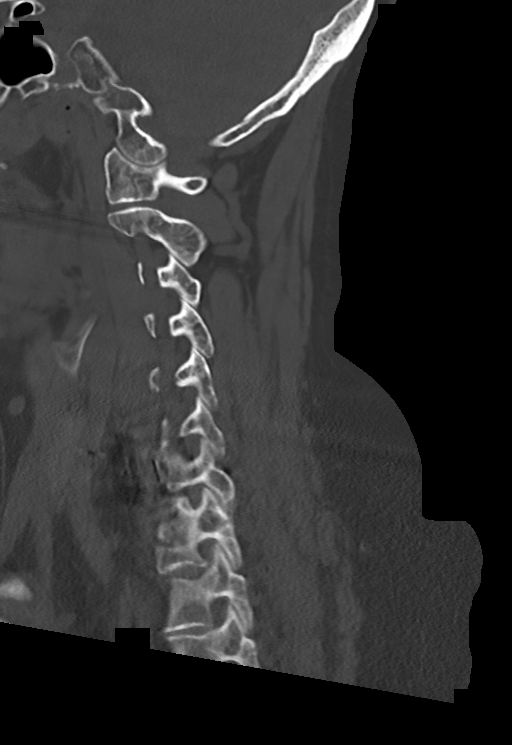
[im 42/83  soft-tissue]
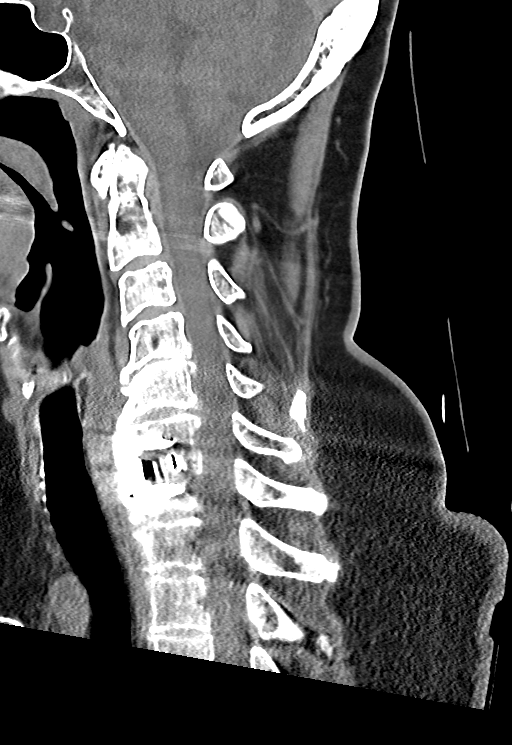
[im 42/83  bone]
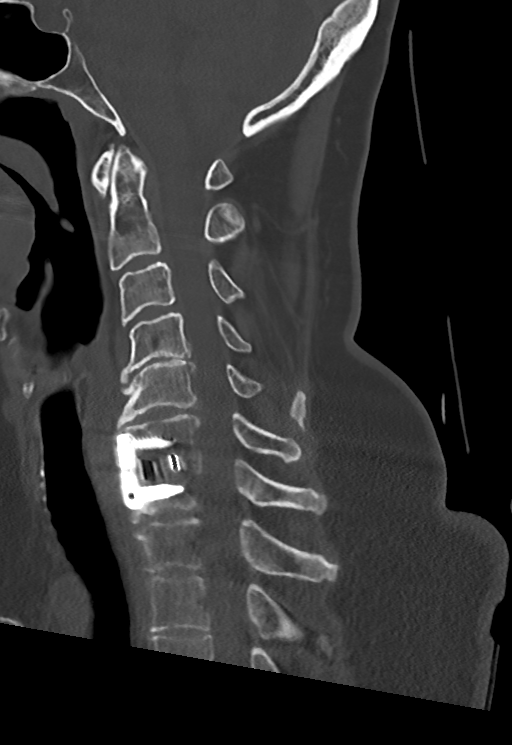
[im 48/83  bone]
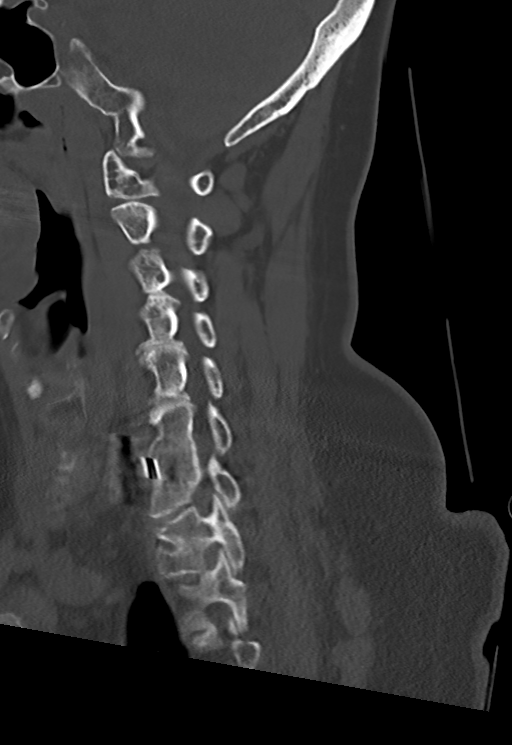
[im 55/83  bone]
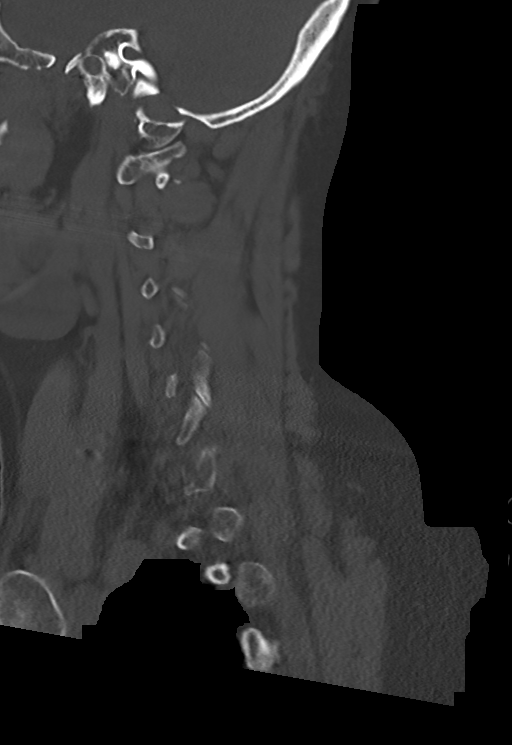

[Series 9: cor bone · coronal · 0.40mm/px · 3 of 81 slices shown]
[im 17/81  bone]
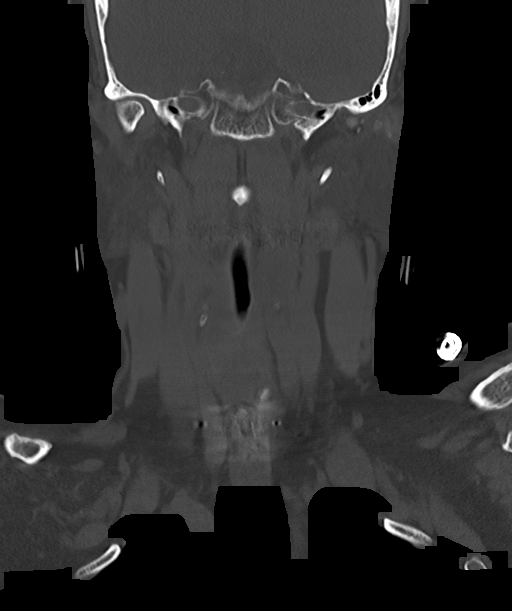
[im 33/81  bone]
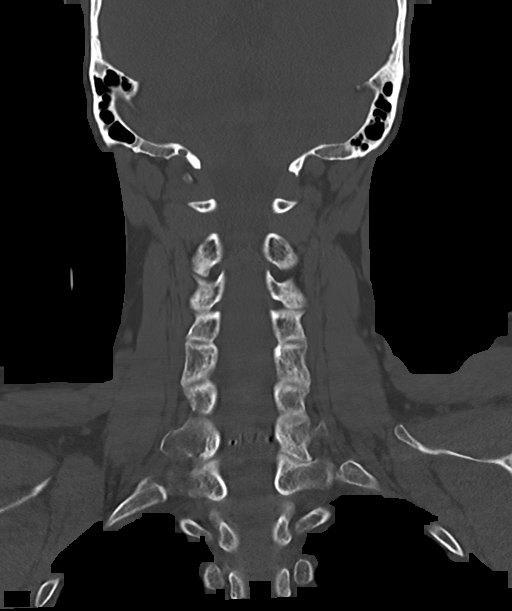
[im 49/81  bone]
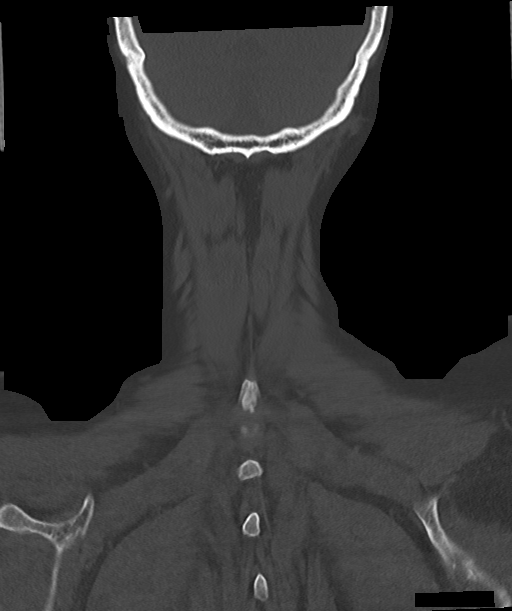

[Series 10: orthogonal axials · axial · 0.21mm/px · z∈[-297,-191]mm · 4 of 96 slices shown, 5 images]
[im 20/96  soft-tissue]
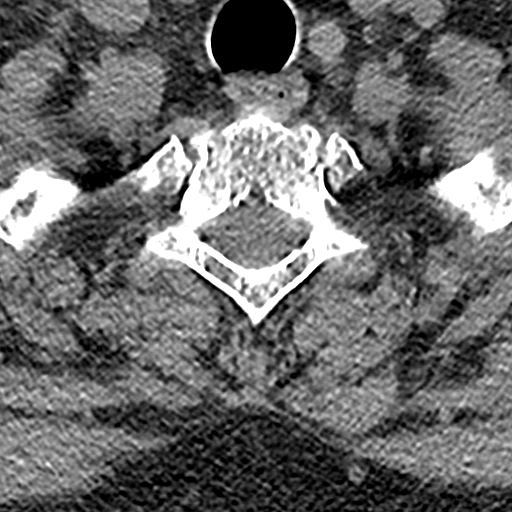
[im 20/96  bone]
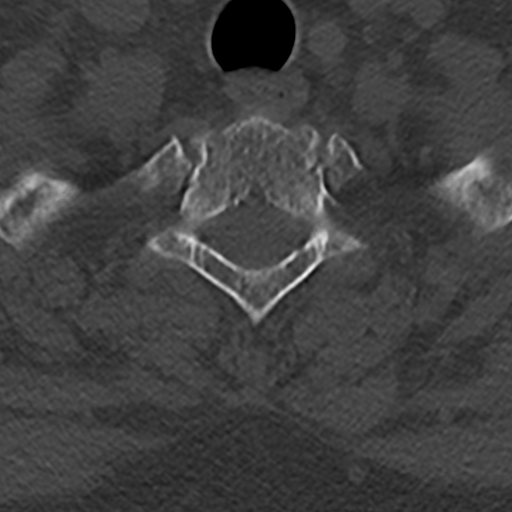
[im 39/96  bone]
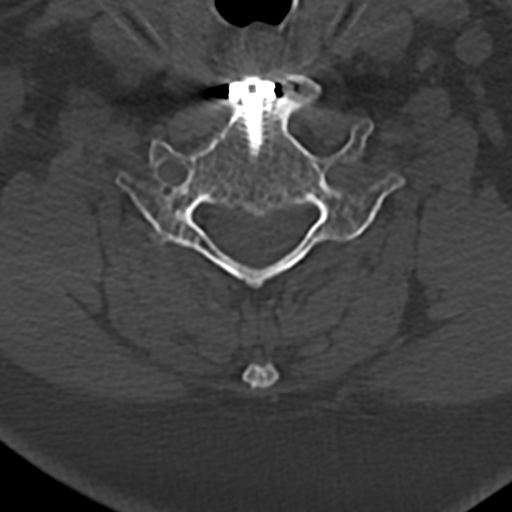
[im 58/96  bone]
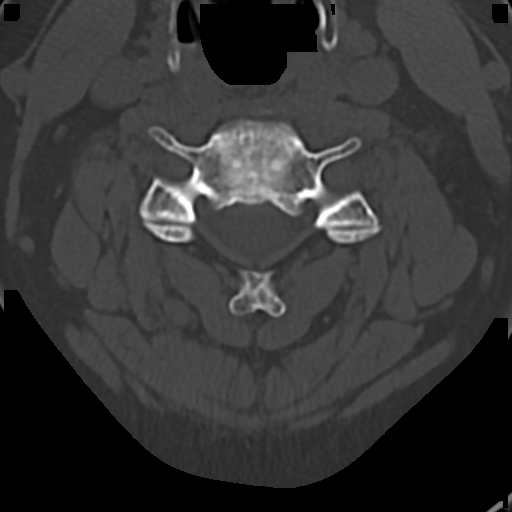
[im 77/96  bone]
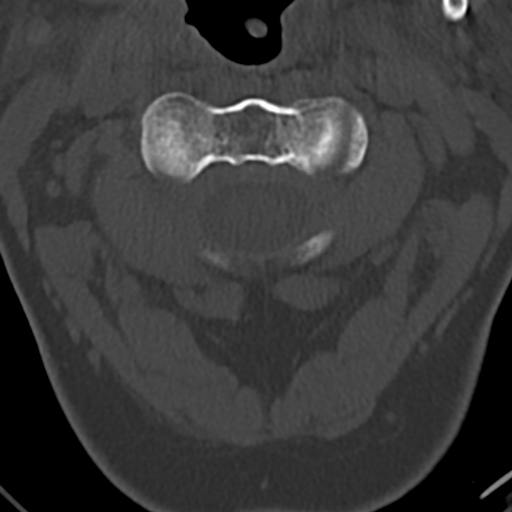

[14 of 33 positions shown; findings below may reference images not displayed]

FINDINGS: CT HEAD FINDINGS

Brain: No acute territorial infarction, hemorrhage, or intracranial
mass. Patchy white matter hypodensity most likely chronic small
vessel change. Nonenlarged ventricles.

Vascular: No hyperdense vessels.  No unexpected calcification

Skull: Normal. Negative for fracture or focal lesion.

Sinuses/Orbits: No acute finding.

Other: None

CT CERVICAL SPINE FINDINGS

Alignment: Straightening of the cervical spine. Facet alignment
maintained. No subluxation

Skull base and vertebrae: No acute fracture. No primary bone lesion
or focal pathologic process.

Soft tissues and spinal canal: No prevertebral fluid or swelling. No
visible canal hematoma.

Disc levels: Anterior fusion changes at C6-C7. Moderate to marked
degenerative changes at C4-C5, C5-C6 and C7-T1.

Upper chest: Negative.

Other: None
IMPRESSION: 1. No CT evidence for acute intracranial abnormality. Chronic small
vessel ischemic changes of the white matter
2. Straightening of the cervical spine with post fusion changes at
C6-C7. No acute osseous abnormality.

## 2021-04-20 IMAGING — CR DG RIBS W/ CHEST 3+V*L*
3 series · 3 of 3 positions shown · non-contrast
Comparison: None.

CLINICAL DATA: Fall with rib pain

EXAM:
LEFT RIBS AND CHEST - 3+ VIEW

[chest pa]
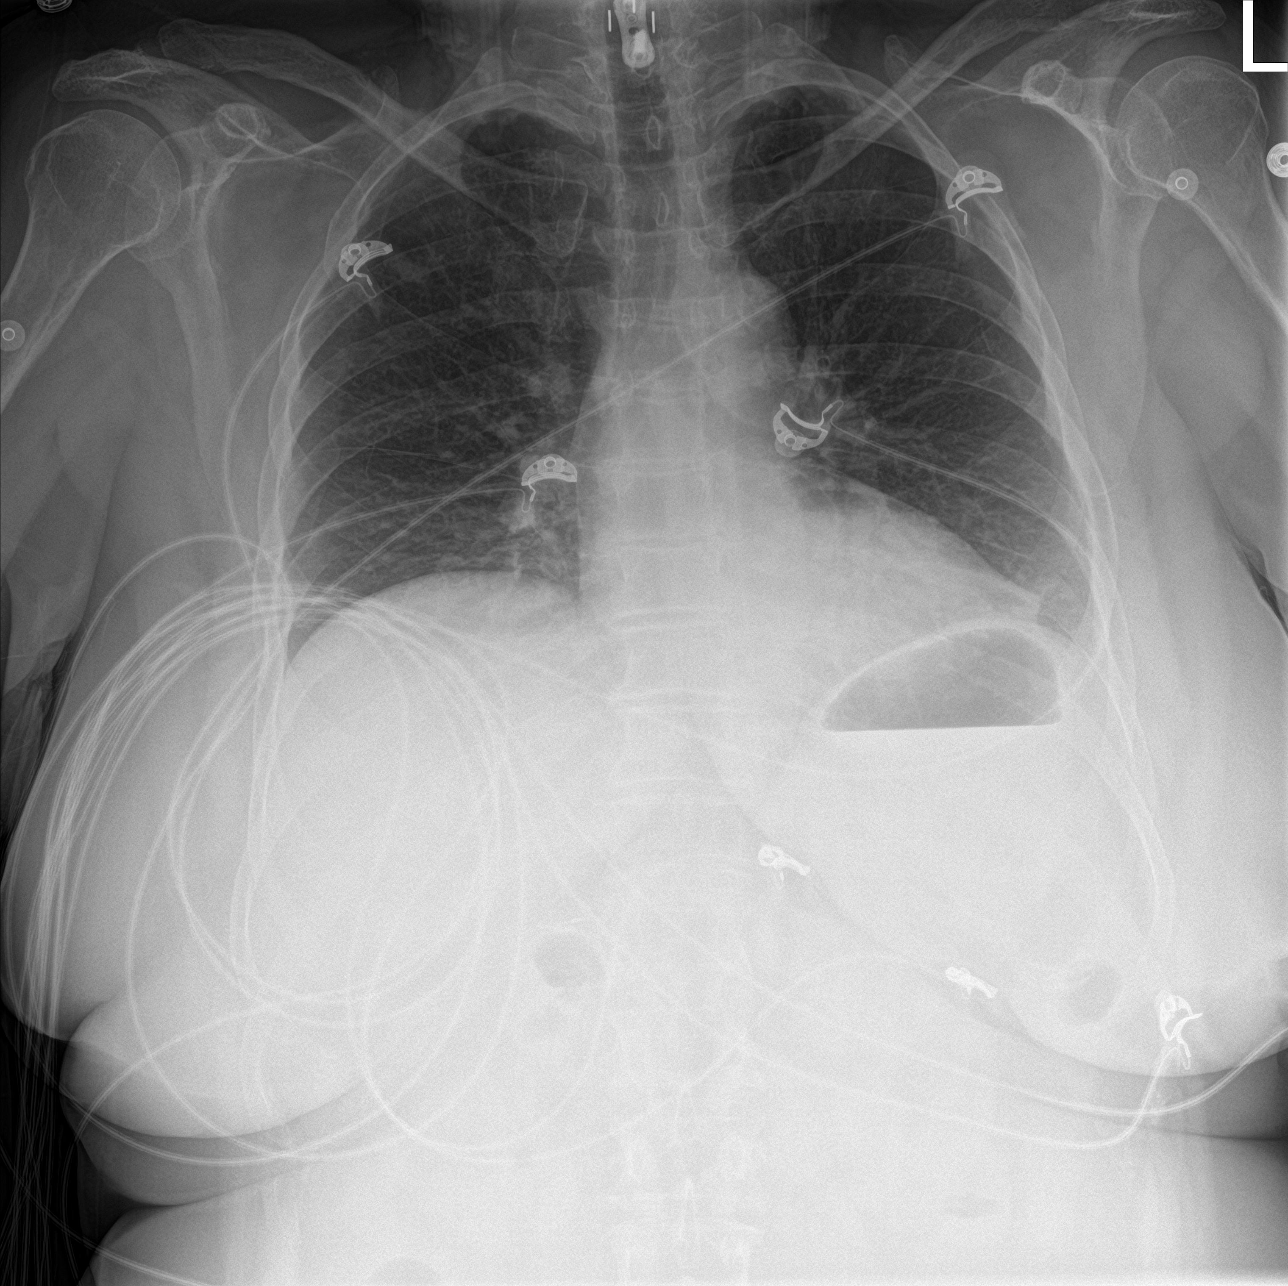

[rib pa obl]
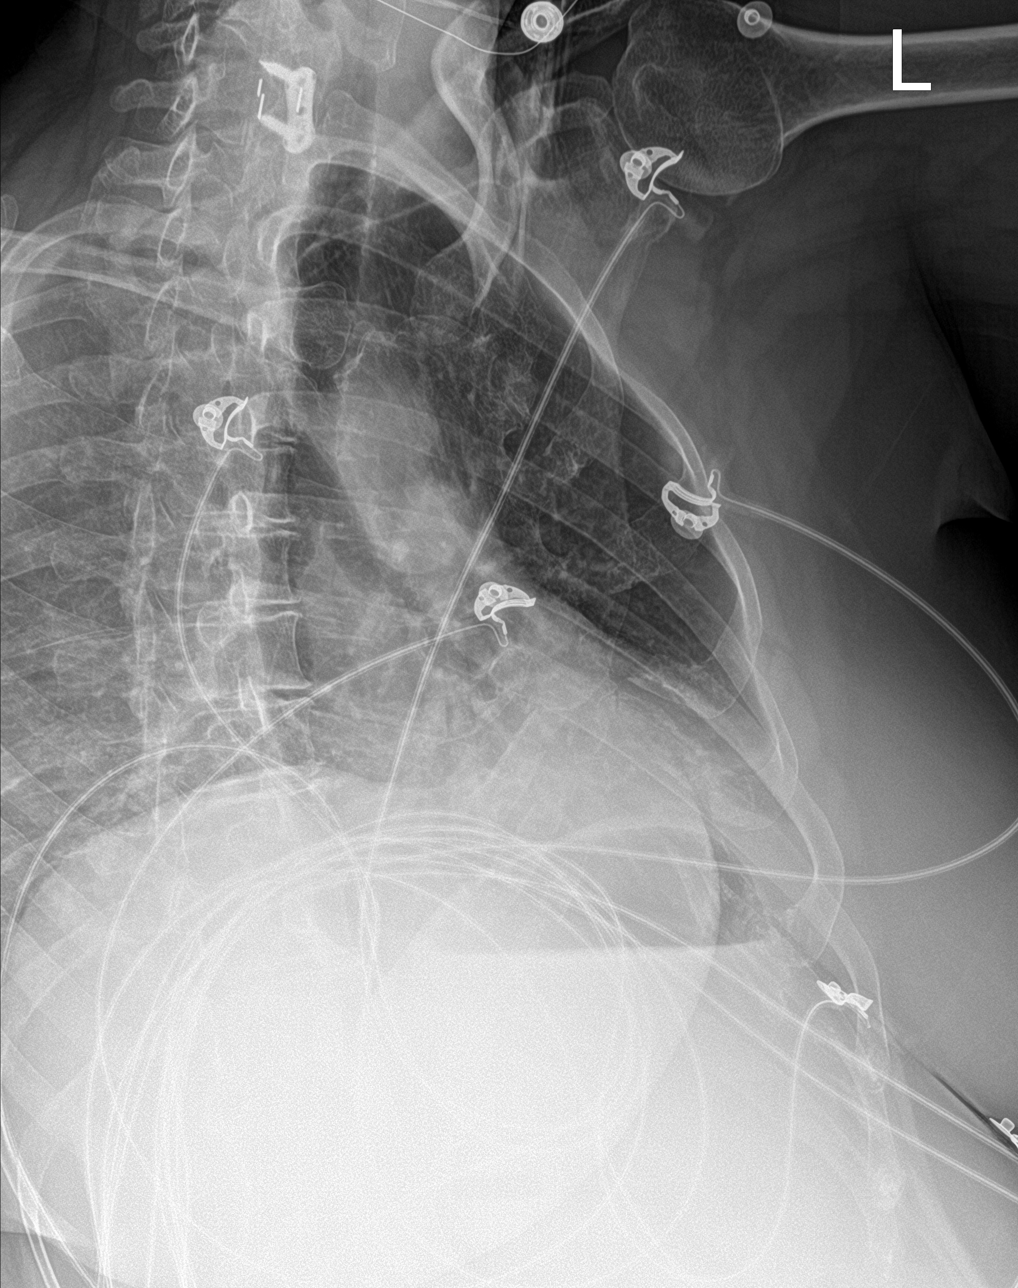

[rib pa]
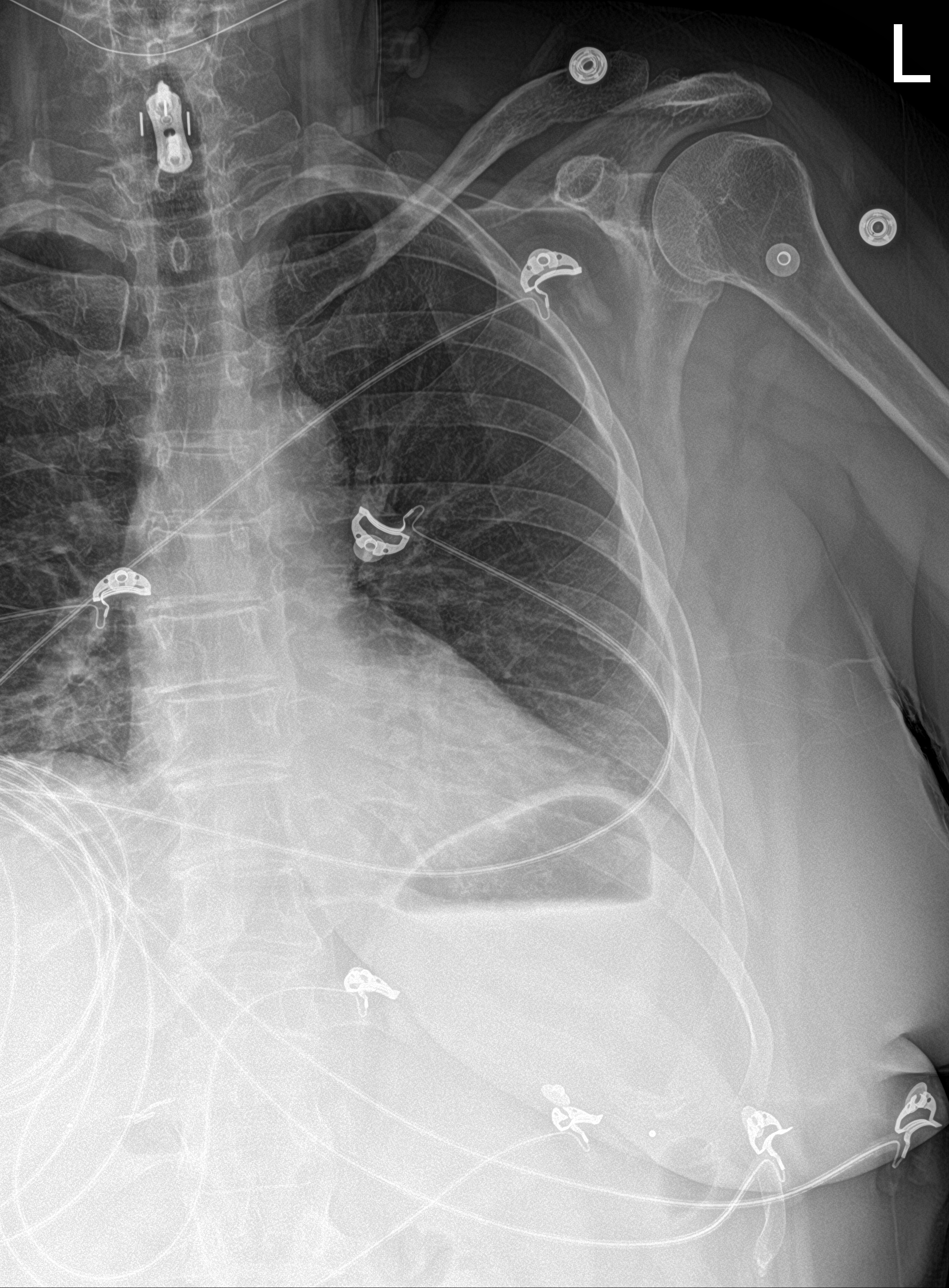

[3 of 3 positions shown; findings below may reference images not displayed]

FINDINGS: Single-view chest demonstrates low lung volumes. No focal opacity,
pleural effusion or pneumothorax. Normal cardiomediastinal
silhouette. Left rib series demonstrates no acute displaced left rib
fracture
IMPRESSION: Negative.

## 2021-04-20 IMAGING — CT CT HEAD W/O CM
4 series · 16 of 47 positions shown, 18 images · non-contrast
Comparison: None.

CLINICAL DATA: Fall trauma

EXAM:
CT HEAD WITHOUT CONTRAST
CT CERVICAL SPINE WITHOUT CONTRAST
TECHNIQUE: Multidetector CT imaging of the head and cervical spine was
performed following the standard protocol without intravenous
contrast. Multiplanar CT image reconstructions of the cervical spine
were also generated.

[Series 3: head wo · axial · 0.44mm/px · z∈[-138,-18]mm · 7 of 34 slices shown, 9 images]
[im 5/34  brain]
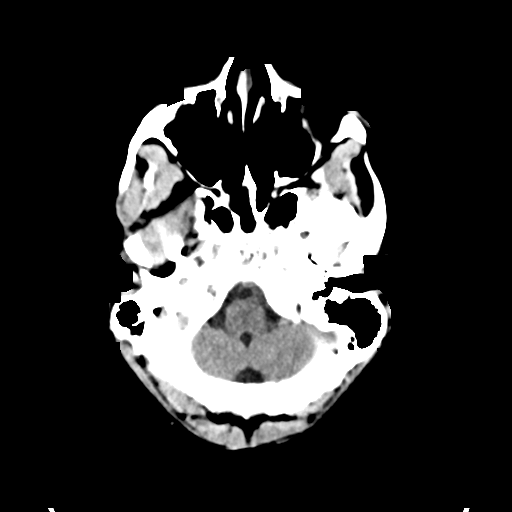
[im 5/34  bone]
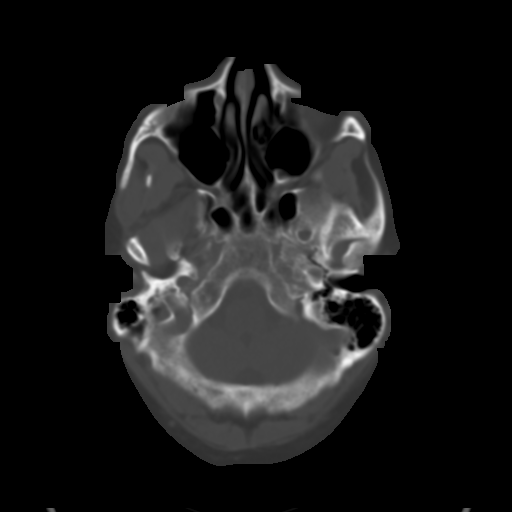
[im 9/34  brain]
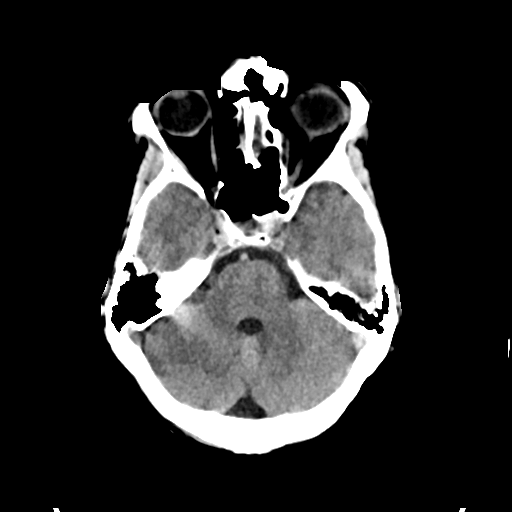
[im 13/34  brain]
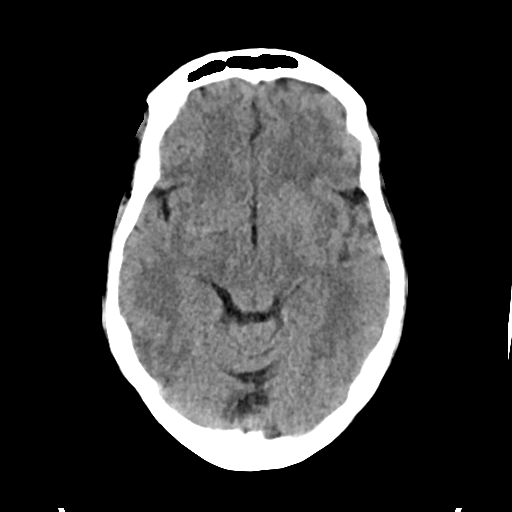
[im 17/34  brain]
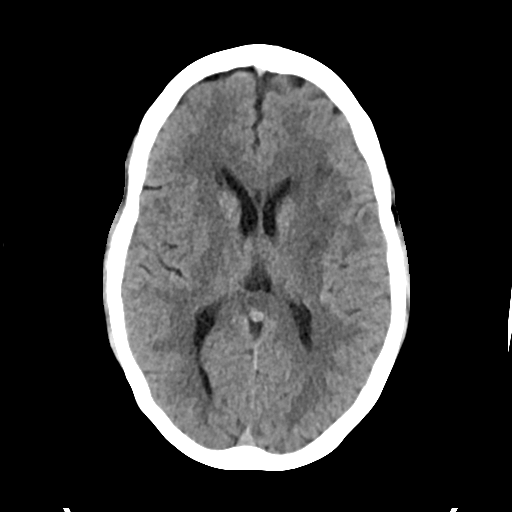
[im 21/34  brain]
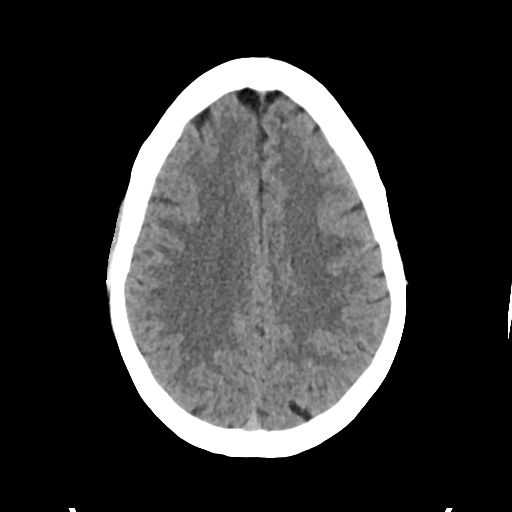
[im 21/34  bone]
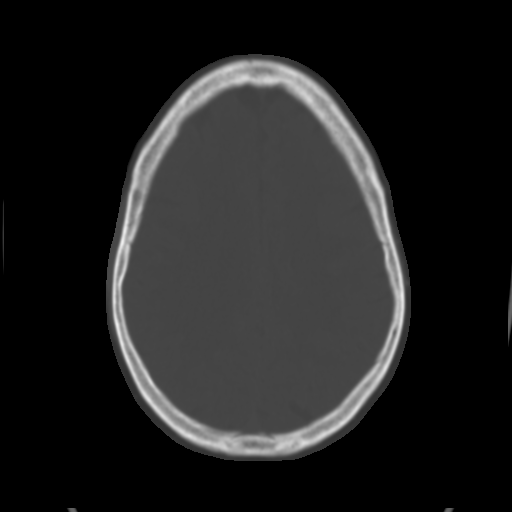
[im 25/34  brain]
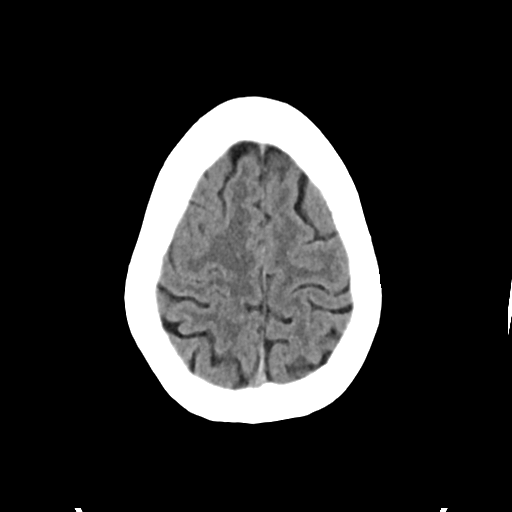
[im 29/34  brain]
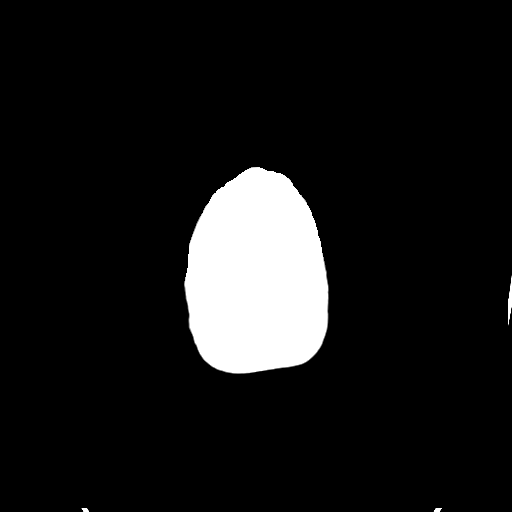

[Series 4: head bone · axial · 0.44mm/px · z∈[-142,-110]mm · 3 of 84 slices shown]
[im 9/84  bone]
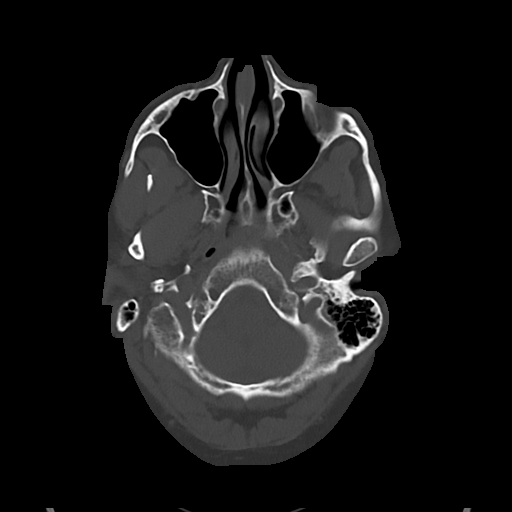
[im 17/84  bone]
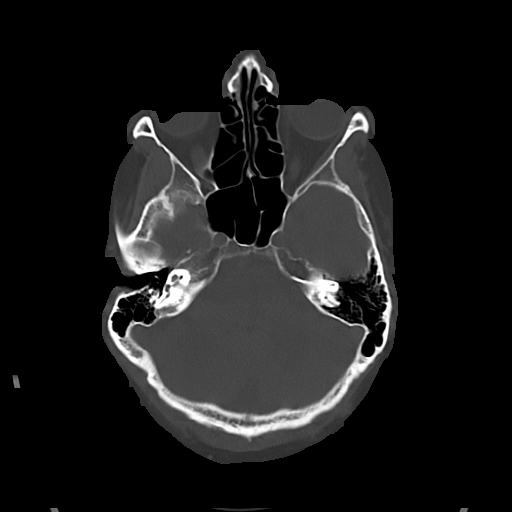
[im 25/84  bone]
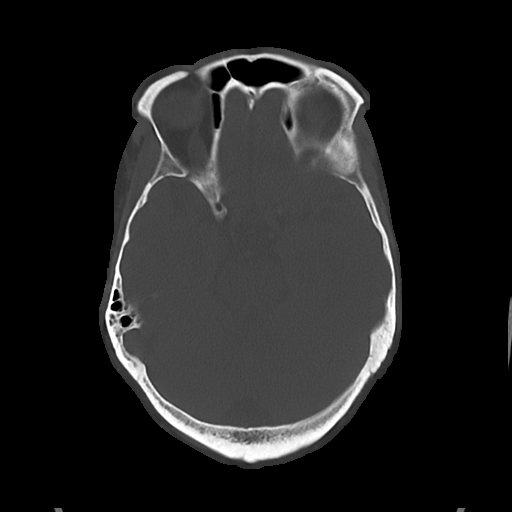

[Series 5: cor soft · coronal · 0.32mm/px · 3 of 71 slices shown]
[im 24/71  brain]
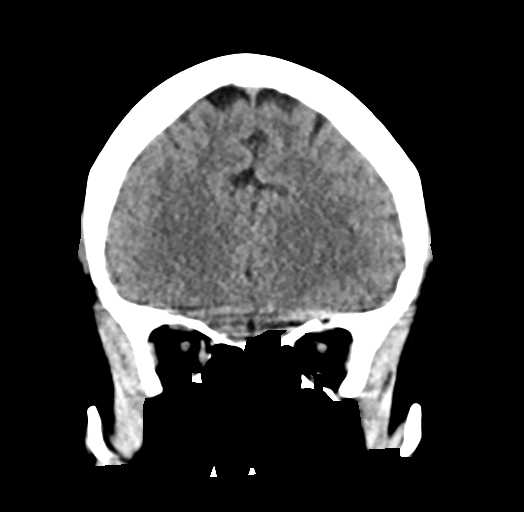
[im 32/71  brain]
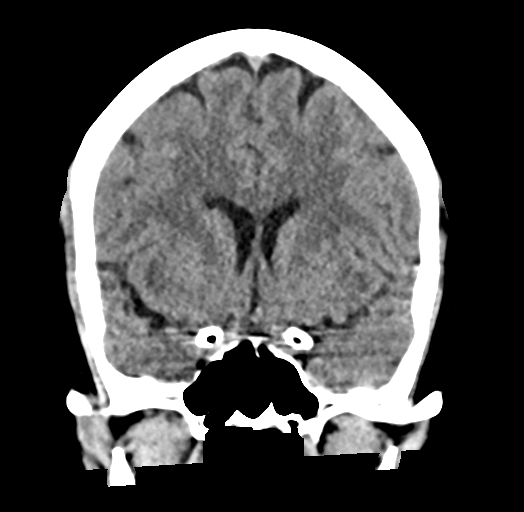
[im 39/71  brain]
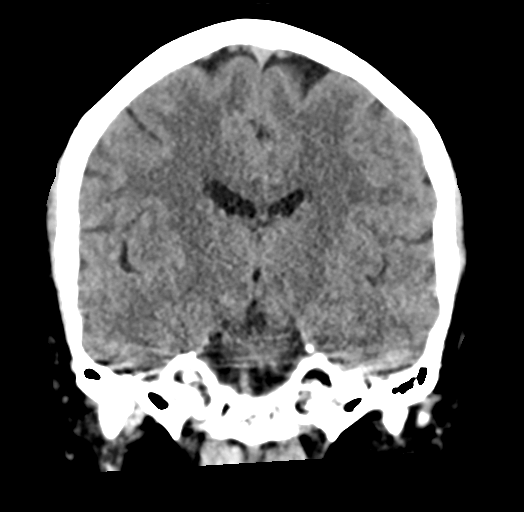

[Series 6: sag soft · sagittal · 0.32mm/px · 3 of 56 slices shown]
[im 19/56  brain]
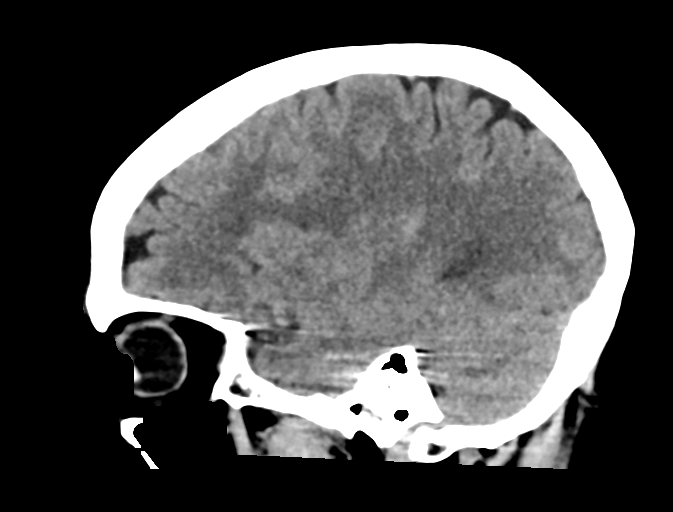
[im 28/56  brain]
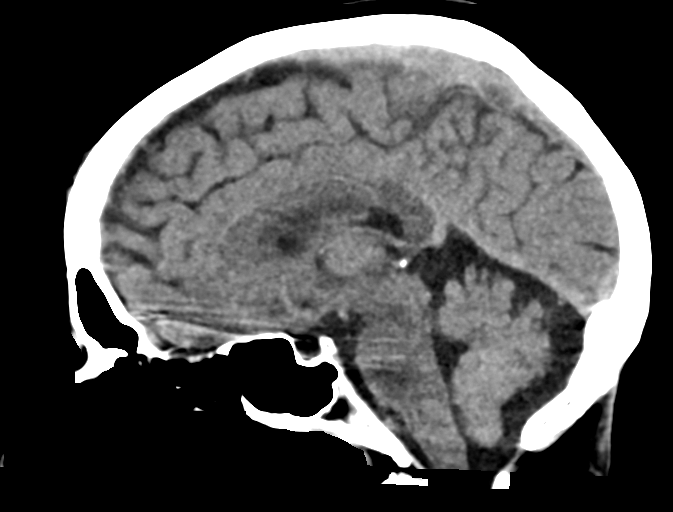
[im 37/56  brain]
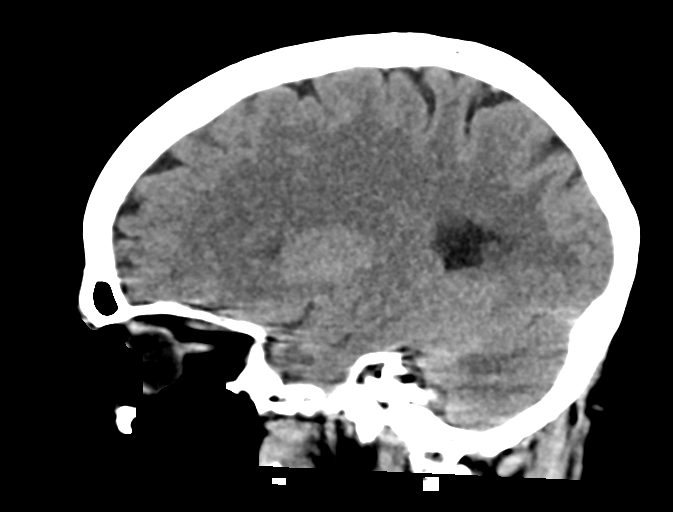

[16 of 47 positions shown; findings below may reference images not displayed]

FINDINGS: CT HEAD FINDINGS

Brain: No acute territorial infarction, hemorrhage, or intracranial
mass. Patchy white matter hypodensity most likely chronic small
vessel change. Nonenlarged ventricles.

Vascular: No hyperdense vessels.  No unexpected calcification

Skull: Normal. Negative for fracture or focal lesion.

Sinuses/Orbits: No acute finding.

Other: None

CT CERVICAL SPINE FINDINGS

Alignment: Straightening of the cervical spine. Facet alignment
maintained. No subluxation

Skull base and vertebrae: No acute fracture. No primary bone lesion
or focal pathologic process.

Soft tissues and spinal canal: No prevertebral fluid or swelling. No
visible canal hematoma.

Disc levels: Anterior fusion changes at C6-C7. Moderate to marked
degenerative changes at C4-C5, C5-C6 and C7-T1.

Upper chest: Negative.

Other: None
IMPRESSION: 1. No CT evidence for acute intracranial abnormality. Chronic small
vessel ischemic changes of the white matter
2. Straightening of the cervical spine with post fusion changes at
C6-C7. No acute osseous abnormality.

## 2021-04-20 MED ORDER — FENTANYL CITRATE (PF) 100 MCG/2ML IJ SOLN
100.0000 ug | Freq: Once | INTRAMUSCULAR | Status: AC
  Administered 2021-04-20: 100 ug via INTRAVENOUS
  Filled 2021-04-20: qty 2

## 2021-04-20 MED ORDER — SODIUM CHLORIDE 0.9 % IV BOLUS
1000.0000 mL | Freq: Once | INTRAVENOUS | Status: AC
  Administered 2021-04-20: 1000 mL via INTRAVENOUS

## 2021-04-20 MED ORDER — METHOCARBAMOL 500 MG PO TABS: 500.0000 mg | ORAL_TABLET | Freq: Three times a day (TID) | ORAL | 0 refills | Status: DC | PRN

## 2021-04-20 NOTE — Discharge Instructions (Addendum)
You came to the emerge department today to be evaluated for your syncopal episode, neck pain, and left chest wall pain.  Your physical exam and lab work were reassuring.  CT scan of your head and neck showed no acute abnormalities.  The x-ray of your ribs showed no broken bones or dislocations.  Your syncopal episode may have been due to a vasovagal response from straining while on the toilet.  Please follow-up with your primary care provider for repeat assessment within the next week.  Today you were prescribed Methocarbamol (Robaxin).  Methocarbamol (Robaxin) is used to treat muscle spasms/pain.  It works by helping to relax the muscles.  Drowsiness, dizziness, lightheadedness, stomach upset, nausea/vomiting, or blurred vision may occur.  Do not drive, use machinery, or do anything that needs alertness or clear vision until you can do it safely.  Do not combine this medication with alcoholic beverages, marijuana, or other central nervous system depressants.    Today you received medications that may make you sleepy or impair your ability to make decisions.  For the next 24 hours please do not drive, operate heavy machinery, care for a small child with out another adult present, or perform any activities that may cause harm to you or someone else if you were to fall asleep or be impaired.   Get help right away if: You cannot move a part of your body (you have paralysis). You have neck pain along with severe dizziness or headache. Have a severe headache. Faint once or repeatedly. Have pain in your chest, abdomen, or back. Have a very fast or irregular heartbeat (palpitations). Have pain when you breathe. Are bleeding from your mouth or rectum, or you have black or tarry stool. Have a seizure. Are confused. Have trouble walking. Have severe weakness. Have vision problems.

## 2021-04-20 NOTE — ED Provider Notes (Signed)
[facility] EMERGENCY DEPARTMENT Provider Note   CSN: [id number] Arrival date & time: 04/20/21  1720     History Chief Complaint  Patient presents with   Loss of Consciousness    Fall     Aimee Bruce is a 60 y.o. female with a history of arthritis, migraines.  Patient presents emergency department with a chief complaint of syncopal episode, neck pain and left chest wall pain.  Patient reports that earlier this afternoon just prior to her arrival in the emergency department she had a syncopal episode.  Syncopal episode occurred after she was straining to have a bowel movement on the toilet.  Patient states that she had a "weird sensation," and then lost consciousness.  Endorses falling off of the commode.  Patient states that her roommate felt that the patient fell and came to check on her.  Patient was struck in the head when the roommate attempted to open the door.  She was able to move out of the way and EMS was called.  Patient complains of pain to posterior left side of neck.  Patient rates pain 10/10 on the pain scale.  Pain is worse with movement or touch.  Patient has not tried any modalities for alleviating her pain.  Patient also complains of pain to her forehead where a contusion is located as well as left chest wall pain.  Left chest wall did not starting until after the patient's fall.  Patient endorses seeing "black spots" in her right eye.  Patient states that this is not new and has been present constantly for the last 4 months.  Per chart review patient was given information to follow-up with ophthalmologist.  Patient also endorses numbness and tingling radiating from right shoulder to right middle finger.  This has been present for the last 2 weeks.  This is unchanged after fall.  Patient denies any chest pain, shortness of breath, or sudden onset of headache prior to her syncopal episode.  Patient denies any bowel or bladder dysfunction, saddle  anesthesia.   Loss of Consciousness Associated symptoms: chest pain (Left sided chest wall beginning after fall)   Associated symptoms: no confusion, no dizziness, no fever, no headaches, no nausea, no seizures, no shortness of breath, no vomiting and no weakness       Past Medical History:  Diagnosis Date   Arthritis    Migraine     Patient Active Problem List   Diagnosis Date Noted   Migraine 06/25/2016    No past surgical history on file.   OB History   No obstetric history on file.     Family History  Problem Relation Age of Onset   Rheum arthritis Mother    Hypertension Mother    Healthy Father     Social History   Tobacco Use   Smoking status: Never   Smokeless tobacco: Never  Vaping Use   Vaping Use: Never used  Substance Use Topics   Alcohol use: Yes   Drug use: Never    Home Medications Prior to Admission medications   Medication Sig Start Date End Date Taking? Authorizing Provider  amphetamine-dextroamphetamine (ADDERALL) 30 MG tablet daily.    [provider]  DULoxetine (CYMBALTA) 30 MG capsule Take 1 capsule (30 mg total) by mouth daily. 03/02/21   Kirsteins, Victorino Sparrow, MD  gabapentin (NEURONTIN) 100 MG capsule Take 100 mg by mouth 3 (three) times daily. 01/05/21   [provider]  gabapentin (NEURONTIN) 300 MG capsule  Take 300 mg by mouth 3 (three) times daily. 11/01/20   [provider]  meloxicam (MOBIC) 7.5 MG tablet Take 1 tablet (7.5 mg total) by mouth daily. 04/19/21   Valinda Hoar, NP  omeprazole (PRILOSEC) 40 MG capsule Take 40 mg by mouth daily. 11/03/20   [provider]  oxyCODONE-acetaminophen (PERCOCET/ROXICET) 5-325 MG tablet Take 1 tablet by mouth every 8 (eight) hours as needed for severe pain. Patient not taking: Reported on 03/02/2021 01/08/21   Hilts, Casimiro Needle, MD  SUMAtriptan (IMITREX) 25 MG tablet Take 25 mg by mouth every 2 (two) hours as needed for migraine. 12/06/20   [provider]   traMADol (ULTRAM) 50 MG tablet Take 50 mg by mouth 3 (three) times daily as needed for moderate pain. 03/27/21   [provider]  zolpidem (AMBIEN) 10 MG tablet Take 10 mg by mouth at bedtime as needed. 12/06/20   [provider]    Allergies    Patient has no known allergies.  Review of Systems   Review of Systems  Constitutional:  Negative for chills and fever.  Eyes:  Positive for visual disturbance.  Respiratory:  Negative for shortness of breath.   Cardiovascular:  Positive for chest pain (Left sided chest wall beginning after fall) and syncope.  Gastrointestinal:  Positive for diarrhea. Negative for abdominal pain, nausea and vomiting.  Genitourinary:  Negative for difficulty urinating.  Musculoskeletal:  Positive for back pain (chronic low back pain, unchanged) and neck pain.  Skin:  Negative for color change, pallor, rash and wound.  Neurological:  Positive for syncope and numbness. Negative for dizziness, tremors, seizures, facial asymmetry, weakness, light-headedness and headaches.  Psychiatric/Behavioral:  Negative for confusion.    Physical Exam Updated Vital Signs BP (!) 142/88   Pulse 96   Temp 97.7 F (36.5 C)   Resp (!) 25   SpO2 96%   Physical Exam Vitals and nursing note reviewed.  Constitutional:      General: She is not in acute distress.    Appearance: She is not ill-appearing, toxic-appearing or diaphoretic.     Interventions: Cervical collar in place.  HENT:     Head: Normocephalic. Contusion present. No raccoon eyes, Battle's sign, abrasion, masses, right periorbital erythema, left periorbital erythema or laceration.     Jaw: No trismus or pain on movement.     Comments: Contusion to midline frontal aspect of scalp, tenderness to palpation    Mouth/Throat:     Mouth: No injury or lacerations.     Pharynx: Oropharynx is clear. Uvula midline. No pharyngeal swelling, oropharyngeal exudate, posterior oropharyngeal erythema or uvula  swelling.  Eyes:     General: No scleral icterus.       Right eye: No discharge.        Left eye: No discharge.     Extraocular Movements: Extraocular movements intact.     Pupils: Pupils are equal, round, and reactive to light.  Cardiovascular:     Rate and Rhythm: Normal rate.  Pulmonary:     Effort: Pulmonary effort is normal. No tachypnea, bradypnea or respiratory distress.  Chest:     Chest wall: Tenderness present. No mass, lacerations, deformity, swelling, crepitus or edema.     Comments: Numbness to left chest wall Abdominal:     General: There is no distension. There are no signs of injury.     Palpations: Abdomen is soft. There is no mass or pulsatile mass.     Tenderness: There is no  abdominal tenderness. There is no guarding or rebound.  Musculoskeletal:     Cervical back: Neck supple. Tenderness and bony tenderness present. No swelling, edema, deformity, erythema, signs of trauma, lacerations, rigidity, spasms, torticollis or crepitus. Pain with movement present. Decreased range of motion.     Thoracic back: No swelling, edema, deformity, signs of trauma, lacerations, spasms, tenderness or bony tenderness.     Lumbar back: Tenderness present. No swelling, edema, deformity, signs of trauma, lacerations, spasms or bony tenderness. Normal range of motion.     Comments: She has diffuse tenderness to cervical neck including midline; no deformity noted  Tenderness to bilateral lumbar spine, chronic and unchanged for patient  Skin:    General: Skin is warm and dry.  Neurological:     General: No focal deficit present.     Mental Status: She is alert and oriented to person, place, and time.     GCS: GCS eye subscore is 4. GCS verbal subscore is 5. GCS motor subscore is 6.     Cranial Nerves: No cranial nerve deficit or facial asymmetry.     Sensory: Sensation is intact.     Motor: No weakness, tremor, seizure activity or pronator drift.     Coordination: Finger-Nose-Finger Test  normal.     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    ED Results / Procedures / Treatments   Labs (all labs ordered are listed, but only abnormal results are displayed) Labs Reviewed  COMPREHENSIVE METABOLIC PANEL - Abnormal; Notable for the following components:      Result Value   Glucose, Bld 156 (*)    Creatinine, Ser 1.08 (*)    GFR, Estimated 60 (*)    All other components within normal limits  CBC WITH DIFFERENTIAL/PLATELET  TROPONIN I (HIGH SENSITIVITY)    EKG EKG Interpretation  Date/Time:  Friday April 20 2021 17:29:33 EDT Ventricular Rate:  96 PR Interval:  164 QRS Duration: 90 QT Interval:  380 QTC Calculation: 481 R Axis:   88 Text Interpretation: Sinus rhythm Low voltage, precordial leads Borderline T abnormalities, anterior leads Confirmed by Marianna Fuss ([id number]) on 04/20/2021 7:35:06 PM  Radiology DG Ribs Unilateral W/Chest Left  Result Date: 04/20/2021 CLINICAL DATA:  Fall with rib pain EXAM: LEFT RIBS AND CHEST - 3+ VIEW COMPARISON:  None. FINDINGS: Single-view chest demonstrates low lung volumes. No focal opacity, pleural effusion or pneumothorax. Normal cardiomediastinal silhouette. Left rib series demonstrates no acute displaced left rib fracture IMPRESSION: Negative. Electronically Signed   By: Jasmine Pang M.D.   On: 04/20/2021 18:49   CT Head Wo Contrast  Result Date: 04/20/2021 CLINICAL DATA:  Fall trauma EXAM: CT HEAD WITHOUT CONTRAST CT  CERVICAL SPINE WITHOUT CONTRAST TECHNIQUE: Multidetector CT imaging of the head and cervical spine was performed following the standard protocol without intravenous contrast. Multiplanar CT image reconstructions of the cervical spine were also generated. COMPARISON:  None. FINDINGS: CT HEAD FINDINGS Brain: No acute territorial infarction, hemorrhage, or intracranial mass. Patchy white matter hypodensity most likely chronic  small vessel change. Nonenlarged ventricles. Vascular: No hyperdense vessels.  No unexpected calcification Skull: Normal. Negative for fracture or focal lesion. Sinuses/Orbits: No acute finding. Other: None CT CERVICAL SPINE FINDINGS Alignment: Straightening of the cervical spine. Facet alignment maintained. No subluxation Skull base and vertebrae: No acute fracture. No primary bone lesion or focal pathologic process. Soft tissues and spinal canal: No prevertebral fluid or swelling. No visible canal hematoma. Disc levels: Anterior fusion changes at C6-C7. Moderate to marked degenerative changes at C4-C5, C5-C6 and C7-T1. Upper chest: Negative. Other: None IMPRESSION: 1. No CT evidence for acute intracranial abnormality. Chronic small vessel ischemic changes of the white matter 2. Straightening of the cervical spine with post fusion changes at C6-C7. No acute osseous abnormality. Electronically Signed   By: Jasmine PangKim  Fujinaga M.D.   On: 04/20/2021 18:47   CT Cervical Spine Wo Contrast  Result Date: 04/20/2021 CLINICAL DATA:  Fall trauma EXAM: CT HEAD WITHOUT CONTRAST CT CERVICAL SPINE WITHOUT CONTRAST TECHNIQUE: Multidetector CT imaging of the head and cervical spine was performed following the standard protocol without intravenous contrast. Multiplanar CT image reconstructions of the cervical spine were also generated. COMPARISON:  None. FINDINGS: CT HEAD FINDINGS Brain: No acute territorial infarction, hemorrhage, or intracranial mass. Patchy white matter hypodensity most likely chronic small  vessel change. Nonenlarged ventricles. Vascular: No hyperdense vessels.  No unexpected calcification Skull: Normal. Negative for fracture or focal lesion. Sinuses/Orbits: No acute finding. Other: None CT CERVICAL SPINE FINDINGS Alignment: Straightening of the cervical spine. Facet alignment maintained. No subluxation Skull base and vertebrae: No acute fracture. No primary bone lesion or focal pathologic process. Soft tissues and spinal canal: No prevertebral fluid or swelling. No visible canal hematoma. Disc levels: Anterior fusion changes at C6-C7. Moderate to marked degenerative changes at C4-C5, C5-C6 and C7-T1. Upper chest: Negative. Other: None IMPRESSION: 1. No CT evidence for acute intracranial abnormality. Chronic small vessel ischemic changes of the white matter 2. Straightening of the cervical spine with post fusion changes at C6-C7. No acute osseous abnormality. Electronically Signed   By: Jasmine PangKim  Fujinaga M.D.   On: 04/20/2021 18:47    Procedures Procedures   Medications Ordered in ED Medications  sodium chloride 0.9 % bolus 1,000 mL (1,000 mLs Intravenous New Bag/Given 04/20/21 1758)  fentaNYL (SUBLIMAZE) injection 100 mcg (100 mcg Intravenous Given 04/20/21 1759)    ED Course  I have reviewed the triage vital signs and the nursing notes.  Pertinent labs & imaging results that were available during my care of the patient were reviewed by me and considered in my medical decision making (see chart for details).     MDM Rules/Calculators/A&P                          Alert 60 year old female no acute distress, nontoxic-appearing.  Patient is restrained via c-collar.  Patient presents emerged department with a chief complaint of syncopal episode and fall off of her commode.  Patient reports that syncopal episode occurred while she was straining to have a bowel movement.  She denies any chest pain, shortness of breath, or sudden onset of headache prior to syncopal episode.  From patient's  description it appears that she was unconscious for only few moments before her roommate found her and patient regained consciousness.    Patient complains of diffuse cervical neck pain, pain to her forehead and left chest wall pain.  Left chest wall pain that not begin until after she suffered her  fall.  On physical exam patient has no focal neurological deficits.  Patient has diffuse tenderness to cervical neck including midline.  No deformity noted.  She has tenderness to left chest wall.  Will obtain CBC, CMP, troponin, left rib x-ray, CT cervical spine and noncontrast head CT.  Contrast head CT and cervical spine CT showed no acute acute abnormalities. Left rib and chest x-ray were unremarkable. CBC unremarkable. Troponin 3. EKG shows normal sinus rhythm. CMP shows creatinine slightly elevated at 1.08; no other serious electrolyte abnormalities.  Patient was given 1 L fluid bolus and fentanyl for pain management.  Patient is hemodynamically stable at this time.  Plan to discharge patient with follow-up to see her primary care provider.  Patient given strict return precautions.  Patient expressed understanding of all instructions and is agreeable to this plan.  Patient was discussed with and evaluated by Dr. Stevie Kern.   Final Clinical Impression(s) / ED Diagnoses Final diagnoses:  Syncope, unspecified syncope type  Neck pain  Left-sided chest wall pain    Rx / DC Orders ED Discharge Orders          Ordered    methocarbamol (ROBAXIN) 500 MG tablet  Every 8 hours PRN        04/20/21 1942             Berneice Heinrich 04/20/21 2001    Milagros Loll, MD 04/22/21 1900

## 2021-04-20 NOTE — ED Triage Notes (Addendum)
Pt BIB [facility] for syncopal episode with fall.  PT was trying to have a bowel movement when she became hot and sweaty, heard a "loud noise"(weird sensation) in her head, and passed out. Her roommate, who is also deaf, felt the thud and came to check on her.  She regained consciousness when the roommate opened door and made contact with pt's head.   She is complaining of left neck pain, left flank pain, and forehead pain. Pt was able to make it up to toilet and complete BM with help of roommate but was not able to ambulate freely prior to EMS arrival.   Pt endorses a fall on 11/16/20 with left hand injury as well as a fall 8 yrs ago that involved left elbow and left rib pain.   Pt does not endorse any allergies or significant medical hx.  She does endorse black floaters in her in vision of her left eye for last 4 months.

## 2021-04-20 NOTE — ED Notes (Signed)
DC instructions reviewed with pt. PT verbalized understanding. PT DC °

## 2021-05-18 ENCOUNTER — Encounter: Payer: Medicare Other | Attending: Physical Medicine & Rehabilitation | Admitting: Physical Medicine & Rehabilitation

## 2021-05-18 DIAGNOSIS — M797 Fibromyalgia: Secondary | ICD-10-CM | POA: Insufficient documentation

## 2021-05-22 ENCOUNTER — Ambulatory Visit (INDEPENDENT_AMBULATORY_CARE_PROVIDER_SITE_OTHER): Payer: Medicare Other | Admitting: Family Medicine

## 2021-05-22 ENCOUNTER — Encounter: Payer: Self-pay | Admitting: Family Medicine

## 2021-05-22 ENCOUNTER — Other Ambulatory Visit: Payer: Self-pay

## 2021-05-22 DIAGNOSIS — M542 Cervicalgia: Secondary | ICD-10-CM | POA: Diagnosis not present

## 2021-05-22 NOTE — Progress Notes (Signed)
   Office Visit Note   Patient: Aimee Bruce           Date of Birth: January 07, 1961           MRN: [id number] Visit Date: 05/22/2021 Requested by: No referring provider defined for this encounter. PCP: Patient, No Pcp Per (Inactive)  Subjective: Chief Complaint  Patient presents with   Neck - Pain    Pain is radiating into the right arm. The patient would like to have an MRI of the neck. She missed her last appointment with Dr. Wynn Banker on 05/18/21 because she did not get the message about that appointment (that office had cancelled her 6/16 appt and changed it to 7/22). She plans to call them to reschedule that appointment.     HPI: She is here with neck pain.  Longstanding problems with her neck, with radiating pain into her right arm.  She saw Dr. Wynn Banker who felt her symptoms are consistent with fibromyalgia.  She was placed on Cymbalta but it has not helped.  She went to the ER and urgent care in June.  She ended up having CT scan of her cervical spine related to a syncopal event causing her to fall.  CT showed post fusion changes at C6-7 with moderate to marked degenerative changes at C4-5, C5-6 and C7-T1.  Sign language interpreter was present today.                ROS:   All other systems were reviewed and are negative.  Objective: Vital Signs: There were no vitals taken for this visit.  Physical Exam:  General:  Alert and oriented, in no acute distress. Pulm:  Breathing unlabored. Psy:  Normal mood, congruent affect.  Neck: She has pain at the extremes of flexion, extension, and rotation bilaterally.  She is tender to palpation near the C7 spinous process.  Upper extremity strength and reflexes remain normal.    Imaging: No results found.  Assessment & Plan: Chronic neck pain with right arm radicular symptoms -We will proceed with MRI of the cervical spine.  Based on the results, we will refer her back to pain clinic.     Procedures: No procedures performed         PMFS History: Patient Active Problem List   Diagnosis Date Noted   Migraine 06/25/2016   Past Medical History:  Diagnosis Date   Arthritis    Migraine     Family History  Problem Relation Age of Onset   Rheum arthritis Mother    Hypertension Mother    Healthy Father     History reviewed. No pertinent surgical history. Social History   Occupational History   Not on file  Tobacco Use   Smoking status: Never   Smokeless tobacco: Never  Vaping Use   Vaping Use: Never used  Substance and Sexual Activity   Alcohol use: Yes   Drug use: Never   Sexual activity: Not on file

## 2021-06-12 ENCOUNTER — Other Ambulatory Visit: Payer: Self-pay

## 2021-06-12 ENCOUNTER — Encounter: Payer: Medicare Other | Attending: Physical Medicine & Rehabilitation | Admitting: Physical Medicine & Rehabilitation

## 2021-06-12 ENCOUNTER — Encounter: Payer: Self-pay | Admitting: Physical Medicine & Rehabilitation

## 2021-06-12 VITALS — BP 158/104 | HR 87 | Ht 67.0 in | Wt 226.4 lb

## 2021-06-12 DIAGNOSIS — M797 Fibromyalgia: Secondary | ICD-10-CM | POA: Insufficient documentation

## 2021-06-12 DIAGNOSIS — M533 Sacrococcygeal disorders, not elsewhere classified: Secondary | ICD-10-CM | POA: Diagnosis present

## 2021-06-12 MED ORDER — NORTRIPTYLINE HCL 10 MG PO CAPS: 10.0000 mg | ORAL_CAPSULE | Freq: Every day | ORAL | 1 refills | Status: DC

## 2021-06-12 NOTE — Progress Notes (Addendum)
Subjective:    Patient ID: Aimee Bruce, female    DOB: Jul 29, 1961, 60 y.o.   MRN: [id number] Here with sign language interpreter HPI 60 year old morbidly obese hearing-impaired female here for follow-up of primary complaint of low back and mid back pain.  She also has pains in the neck as well has of the lower limbs. Interval history positive for ED visit for mid back pain also neck pain.  She has a cervical MRI ordered by her sports medicine physician which will be performed on Friday of this week. Low back pain is slightly below the belt.  She indicates that she has had this pain since having a child many years ago Pain Inventory Average Pain 10 Pain Right Now 10 My pain is sharp and burning  In a lot of pain now and BP is elevated  In the last 24 hours, has pain interfered with the following? General activity 10 Relation with others 10 Enjoyment of life 10 What TIME of day is your pain at its worst? varies Sleep (in general) NA  Pain is worse with: walking, bending, sitting, standing, and some activites Pain improves with: medication and injections Relief from Meds:  not answered      Family History  Problem Relation Age of Onset   Rheum arthritis Mother    Hypertension Mother    Healthy Father    Social History   Socioeconomic History   Marital status: Single    Spouse name: Not on file   Number of children: Not on file   Years of education: Not on file   Highest education level: Not on file  Occupational History   Not on file  Tobacco Use   Smoking status: Never   Smokeless tobacco: Never  Vaping Use   Vaping Use: Never used  Substance and Sexual Activity   Alcohol use: Yes   Drug use: Never   Sexual activity: Not on file  Other Topics Concern   Not on file  Social History Narrative   Not on file   Social Determinants of Health   Financial Resource Strain: Not on file  Food Insecurity: Not on file  Transportation Needs: Not on file  Physical Activity:  Not on file  Stress: Not on file  Social Connections: Not on file   No past surgical history on file. No past surgical history on file. Past Medical History:  Diagnosis Date   Arthritis    Migraine    BP (!) 158/104   Pulse 87   Ht 5\' 7"  (1.702 m)   Wt 226 lb 6.4 oz (102.7 kg)   SpO2 94%   BMI 35.46 kg/m   Opioid Risk Score:   Fall Risk Score:  `1  Depression screen PHQ 2/9  Depression screen [facility] 2/9 06/12/2021 03/02/2021  Decreased Interest 3 3  Down, Depressed, Hopeless 3 3  PHQ - 2 Score 6 6  Altered sleeping - 3  Tired, decreased energy - 3  Change in appetite - 2  Feeling bad or failure about yourself  - 3  Trouble concentrating - 1  Moving slowly or fidgety/restless - 2  Suicidal thoughts - 0  PHQ-9 Score - 20     Review of Systems  Constitutional: Negative.   HENT: Negative.    Eyes: Negative.   Respiratory: Negative.    Cardiovascular: Negative.   Gastrointestinal: Negative.   Endocrine: Negative.   Genitourinary: Negative.   Musculoskeletal:  Positive for arthralgias, back pain, gait problem and neck  pain.  Skin: Negative.   Allergic/Immunologic: Negative.   Hematological: Negative.   Psychiatric/Behavioral:  Positive for dysphoric mood.   All other systems reviewed and are negative.     Objective:   Physical Exam Vitals and nursing note reviewed.  Constitutional:      Appearance: She is obese.  HENT:     Head: Normocephalic and atraumatic.  Eyes:     Extraocular Movements: Extraocular movements intact.     Conjunctiva/sclera: Conjunctivae normal.     Pupils: Pupils are equal, round, and reactive to light.  Musculoskeletal:     Comments:  Sacral thrust (prone) : Positive  FABER's: Positive bilateral Distraction (supine): Positive bilateral Thigh thrust test: Positive bilateral  Skin:    General: Skin is warm and dry.  Neurological:     General: No focal deficit present.     Mental Status: She is alert and oriented to person, place,  and time.     Comments: Motor strength is 5/5 bilateral deltoid bicep tricep grip hip flexor knee extensor ankle dorsiflexor Negative straight leg raising bilaterally Sensation equal bilateral lower limbs   Psychiatric:        Mood and Affect: Mood normal.        Behavior: Behavior normal.          Assessment & Plan:   1.  Lumbar back pelvic pain, exam most consistent with sacroiliac pain we will schedule for bilateral sacroiliac injection. If no improvement after this injection may consider additional lumbar imaging studies We also discussed chiropractic referral which she refuses 2.  Fibromyalgia continue gabapentin 400 mg 3 times daily, has already tried pregabalin without much success, will add nortriptyline 10 mg nightly She is requesting Percocet.  We discussed that Percocet has potential for addiction as well as increased fall risk.  We will hold off on this.

## 2021-06-12 NOTE — Addendum Note (Signed)
Addended by: Erick Colace on: 06/12/2021 04:51 PM   Modules accepted: Level of Service

## 2021-06-20 ENCOUNTER — Other Ambulatory Visit: Payer: Self-pay

## 2021-06-20 ENCOUNTER — Telehealth: Payer: Self-pay | Admitting: Family Medicine

## 2021-06-20 ENCOUNTER — Ambulatory Visit
Admission: RE | Admit: 2021-06-20 | Discharge: 2021-06-20 | Disposition: A | Discharge status: Home / Self Care | Payer: Medicare Other | Source: Ambulatory Visit | Attending: Family Medicine | Admitting: Family Medicine

## 2021-06-20 DIAGNOSIS — M542 Cervicalgia: Secondary | ICD-10-CM

## 2021-06-20 IMAGING — MR MR CERVICAL SPINE W/O CM
4 of 5 series · 30 of 48 positions shown · non-contrast
Comparison: No prior MRI, correlation is made with CT cervical
spine [DATE]

CLINICAL DATA: Neck pain, radiating into arms bilaterally.

EXAM:
MRI CERVICAL SPINE WITHOUT CONTRAST
TECHNIQUE: Multiplanar, multisequence MR imaging of the cervical spine was
performed. No intravenous contrast was administered.

[Series 3: STIR · sagittal · 3.0mm · 0.82mm/px · 7 of 17 slices shown]
[im 1/17]
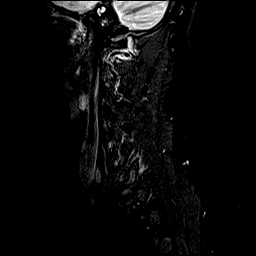
[im 3/17]
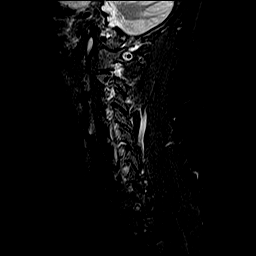
[im 5/17]
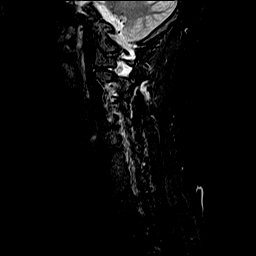
[im 7/17]
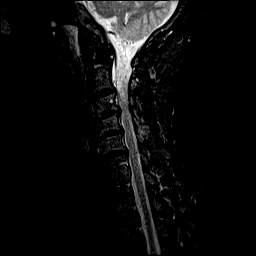
[im 10/17]
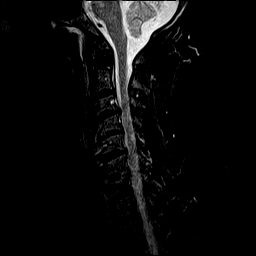
[im 12/17]
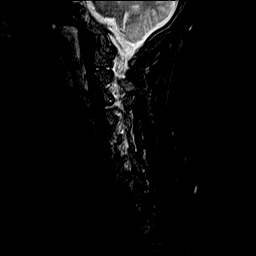
[im 14/17]
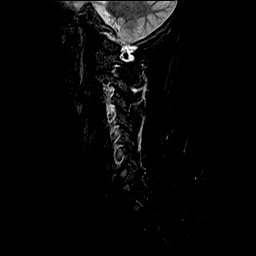

[Series 4: T2 · sagittal · 3.0mm · 0.41mm/px · 7 of 17 slices shown (1 of 3)]
[im 1/17]
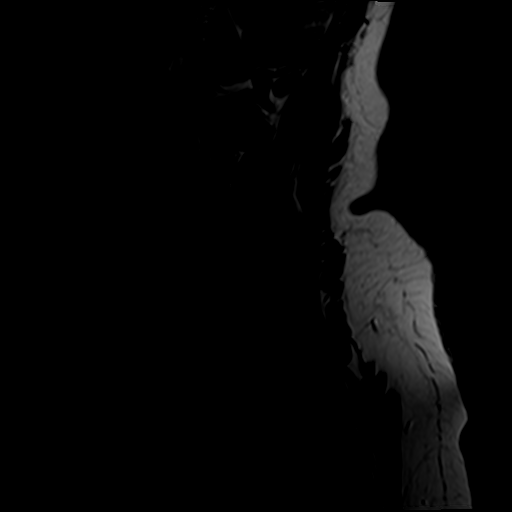
[im 3/17]
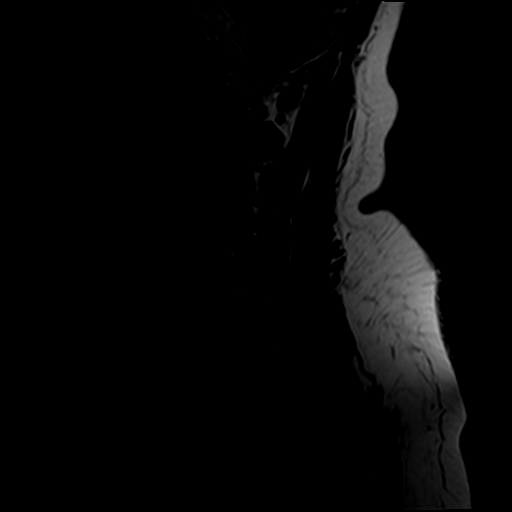
[im 6/17]
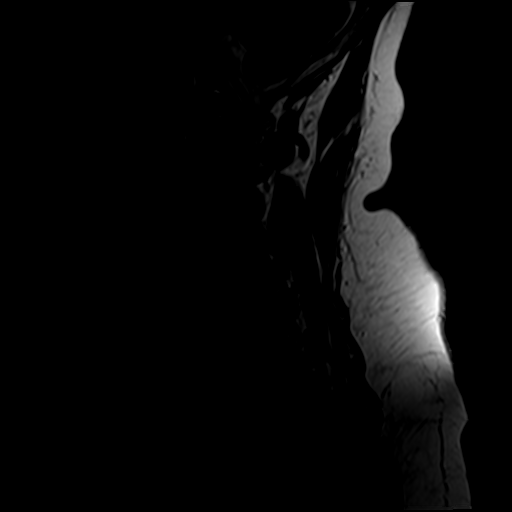
[im 9/17]
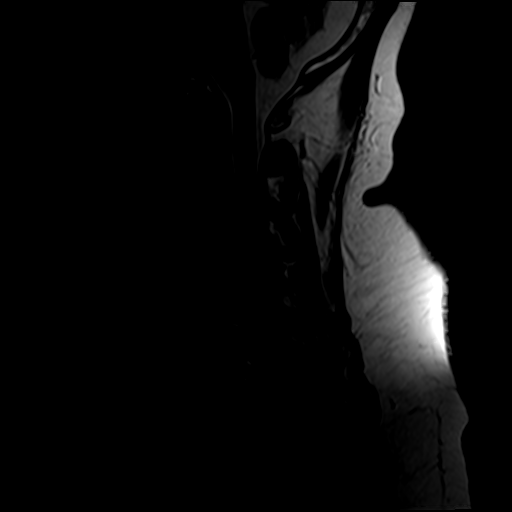
[im 11/17]
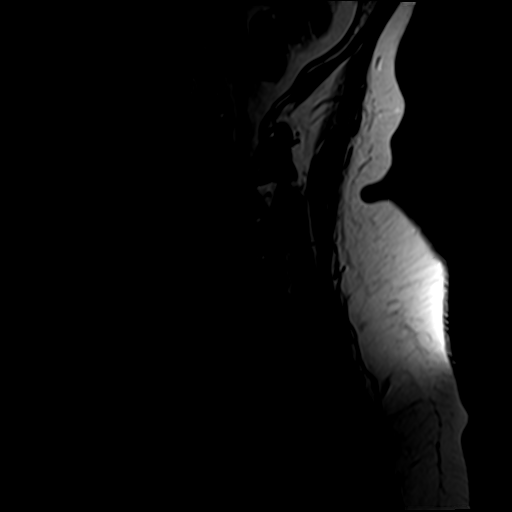
[im 14/17]
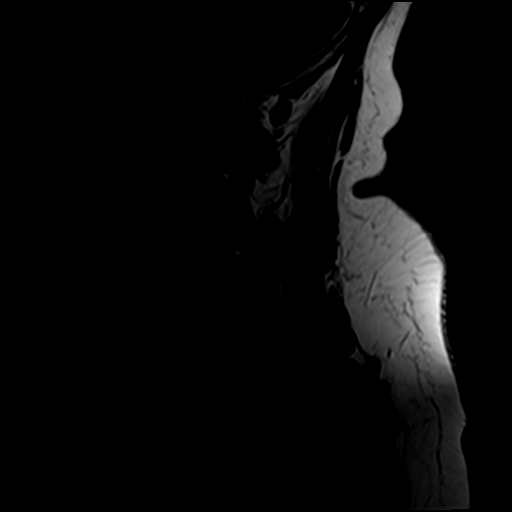
[im 17/17]
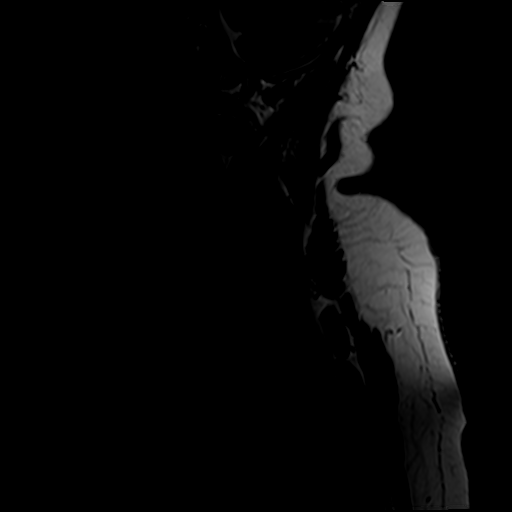

[Series 6: T2 · axial · 3.0mm · 0.70mm/px · z∈[-100,+6]mm · 9 of 29 slices shown (2 of 3)]
[im 1/29]
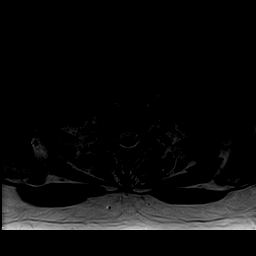
[im 5/29]
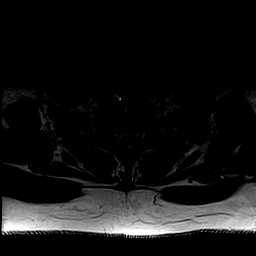
[im 10/29]
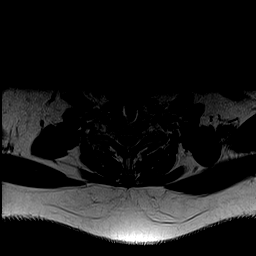
[im 12/29]
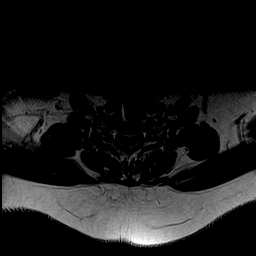
[im 15/29]
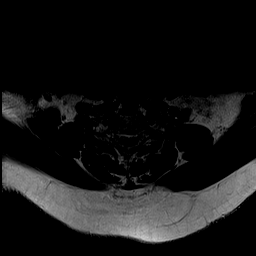
[im 17/29]
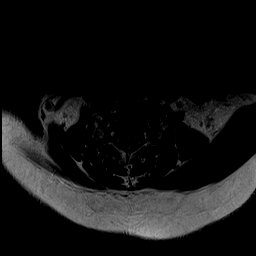
[im 19/29]
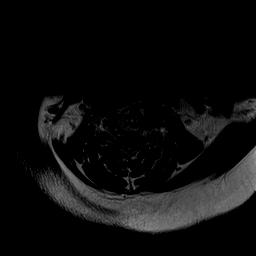
[im 24/29]
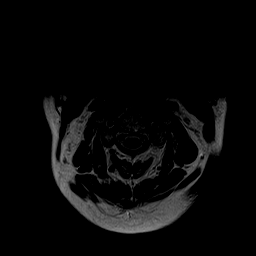
[im 29/29]
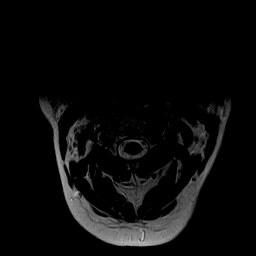

[Series 7: T2 · sagittal · 3.0mm · 0.41mm/px · 7 of 17 slices shown (3 of 3)]
[im 1/17]
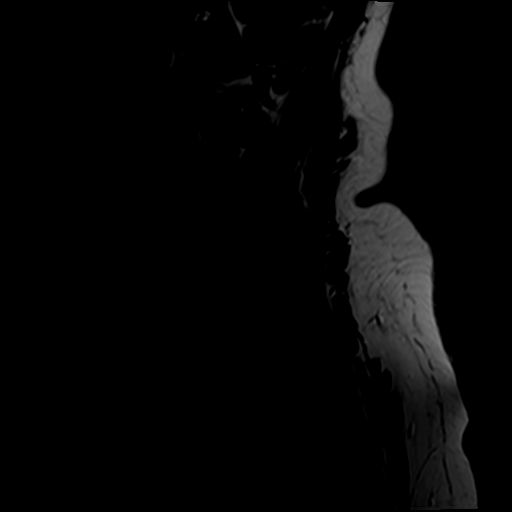
[im 3/17]
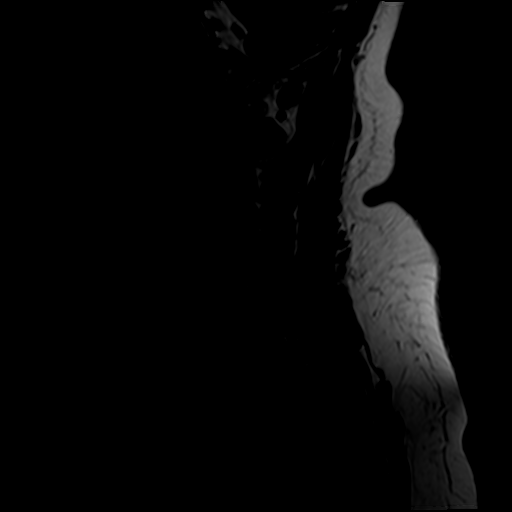
[im 6/17]
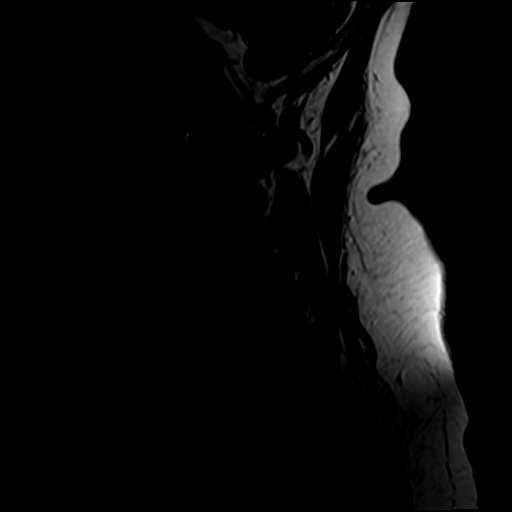
[im 9/17]
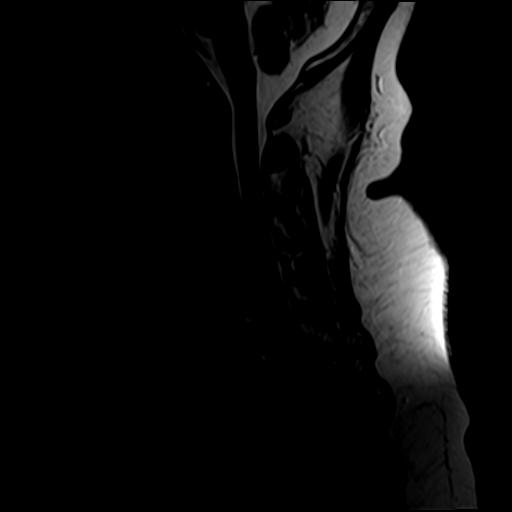
[im 11/17]
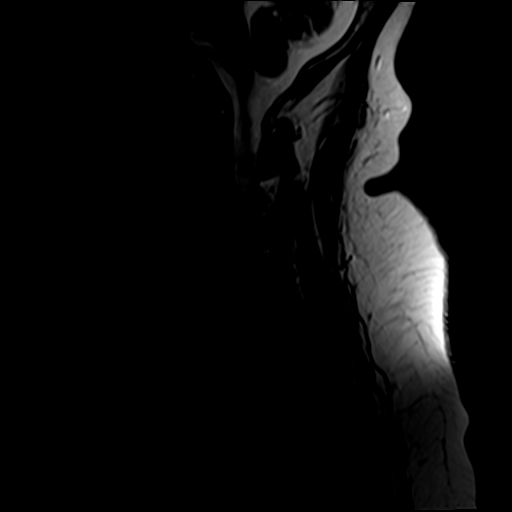
[im 14/17]
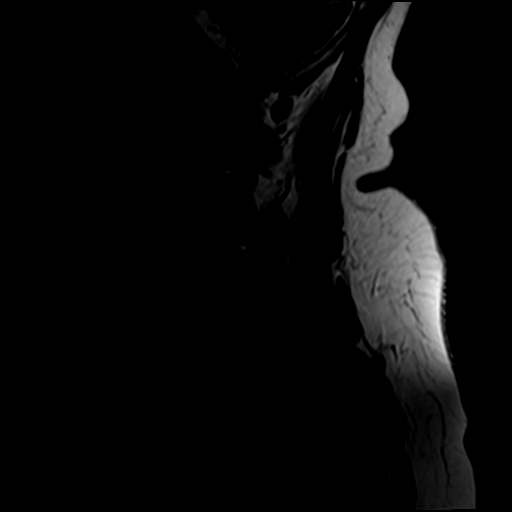
[im 17/17]
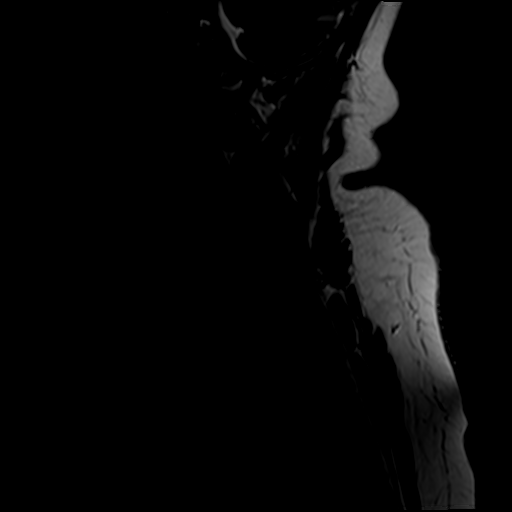

[30 of 48 positions shown; findings below may reference images not displayed]

FINDINGS: Status post C6-C7 ACDF, with osseous fusion across the disc space.

Alignment: Straightening of the normal cervical lordosis. No
significant listhesis.

Vertebrae: No fracture, evidence of discitis, or bone lesion.
Congenitally short pedicles, which narrow the AP diameter of the
cervical spinal canal.

Cord: Normal signal and morphology.

Posterior Fossa, vertebral arteries, paraspinal tissues: Negative.

Disc levels:

C2-C3: Minimal disc bulge. No spinal canal stenosis or
neuroforaminal narrowing.

C3-C4: Minimal disc bulge. Facet and uncovertebral hypertrophy. Mild
spinal canal stenosis. Mild bilateral neural foraminal narrowing.

C4-C5: Disc height loss with broad-based disc bulge. Facet
arthropathy and right greater than left uncovertebral hypertrophy.
Mild spinal canal stenosis. Moderate right neural foraminal
narrowing.

C5-C6: Mild disc bulge. Mild spinal canal stenosis. Mild facet
arthropathy. Mild neural foraminal narrowing bilaterally.

C6-C7: Status post fusion. No spinal canal stenosis or neural
foraminal narrowing.

C7-T1: No significant disc bulge. Facet arthropathy. No spinal canal
stenosis. Moderate left and mild right neural foraminal narrowing.
IMPRESSION: 1. Congenitally short pedicles, which narrow the AP diameter of the
spinal canal; in addition to mild degenerative changes, this causes
mild spinal canal stenosis at C3-C4, C4-C5, and C5-C6.
2. Multilevel uncovertebral and facet arthropathy, which causes
moderate neural foraminal narrowing on the right at C4-C5 and on the
left at C7-T1. There is also mild neural foraminal narrowing
bilaterally at C3-C4 and C5-C6, as well as on the right C7-T1.

## 2021-06-20 NOTE — Telephone Encounter (Signed)
MRI shows arthritis in the joints on the back of the neck a multiple levels. And moderate narrowing of the nerve openings on the right at C4-5, and the left at C7-T1.  No nerve impingement seen, no clear-cut indication for surgery.  Treatment options include physical therapy, or possibly injections.

## 2021-06-21 NOTE — Telephone Encounter (Signed)
I called and left message to call me back regarding these results.

## 2021-06-22 ENCOUNTER — Encounter: Payer: Self-pay | Admitting: Family Medicine

## 2021-06-25 ENCOUNTER — Telehealth: Payer: Self-pay

## 2021-06-25 NOTE — Telephone Encounter (Signed)
Patient called concerning MRI results.  Advised her of letter in her chart, per Dr. Prince Rome and advised her that she could see Dr. Otelia Sergeant or Dr. Ophelia Charter for her neck.  Patient stated that she will think about it and give [place] a call back.

## 2021-07-03 ENCOUNTER — Telehealth: Payer: Self-pay | Admitting: Physical Medicine and Rehabilitation

## 2021-07-03 NOTE — Telephone Encounter (Signed)
Patient called. She would like an appointment with Dr. Alvester Morin. Her call back number is [phone]

## 2021-07-03 NOTE — Telephone Encounter (Signed)
You saw patient for an office visit on 02/16/21. Please advise.

## 2021-07-04 ENCOUNTER — Telehealth: Payer: Self-pay | Admitting: Physical Medicine and Rehabilitation

## 2021-07-04 NOTE — Telephone Encounter (Signed)
Left message #1

## 2021-07-04 NOTE — Progress Notes (Signed)
Aimee Bruce - 60 y.o. female MRN [id number]  Date of birth: 10-09-1961  Office Visit Note: Visit Date: 02/16/2021 PCP: Patient, No Pcp Per (Inactive) Referred by: Lavada Mesi, MD  Subjective: Chief Complaint  Patient presents with   Lower Back - Pain   HPI: Aimee Bruce is a 60 y.o. female who comes in today At the request of Dr. Lavada Mesi for evaluation and management of predominantly chronic worsening severe low back pain with some radicular type pain in the thighs.  He also placed the referral at the same time to the [facility] health physical medicine and rehabilitation group and Dr. Claudette Laws.  The patient does have appointment to see them coming up next month.  She comes in today having been seen in the past chronically by physical medicine and pain management group in [place].  Unfortunately I have any the notes to review on that today.  She reports history of opioid medication including long-term oxycodone as well as trigger point injections and spine injections.  Her case is complicated by history of cervical fusion remotely done in [place] in 2012.  Again she reports a history of lumbar epidural injections as well as trigger point injections and maybe facet joint blocks.  She has not had any radiofrequency ablation that she can remember.  She has allover body pain really from the neck upper back mid back and lower back.  She reports spine injections were started in the thoracic spine first.  I do not have any imaging to review at this point but she does state that she has had MRI imaging in the past.  She has had a history of fibromyalgia.  She reports 9 out of 10 intermittent and aching pain worse with sitting and standing and activity.  Better with medication.  Review of Systems  Musculoskeletal:  Positive for back pain, joint pain and neck pain.  Neurological:  Positive for tingling.  Otherwise per HPI.  Assessment & Plan: Visit Diagnoses:    ICD-10-CM   1. Chronic  midline thoracic back pain  M54.6    G89.29     2. Chronic bilateral low back pain without sciatica  M54.50    G89.29     3. Other intervertebral disc degeneration, lumbar region  M51.36     4. 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 Chronic pain syndrome  G89.4     6. S/P cervical spinal fusion  Z98.1        Plan: Findings:  Fairly widespread overall body pain with history of fibromyalgia and prior cervical fusion with history of being treated in a pain clinic with some form of thoracic injections as well as trigger point injections and epidural type injections.  She reports injections did seem to help her pain quite a bit.  Unfortunately have no notes to review at this point.  I would be happy to see her and help her if I could.  We will request records to be sent to [place].  Also she has a referral placed to Dr. Illa Level group in the [facility] health physical medicine rehabilitation area.  There can be better able to control her pain from a multifactorial standpoint with pain psychology and medication management as well as potential injection.  I have encouraged her to continue with that appointment as well.  We will see her back once we have the records to review.   Meds & Orders: No orders of the defined types  were placed in this encounter.  No orders of the defined types were placed in this encounter.   Follow-up: Return for obtain records o finjections, new appointment with Dr. Wynn Banker..   Procedures: No procedures performed      Clinical History: MRI CERVICAL SPINE WITHOUT CONTRAST   TECHNIQUE: Multiplanar, multisequence MR imaging of the cervical spine was performed. No intravenous contrast was administered.   COMPARISON:  No prior MRI, correlation is made with CT cervical spine 04/20/2021   FINDINGS: Status post C6-C7 ACDF, with osseous fusion across the disc space.   Alignment: Straightening of the normal cervical lordosis. No significant  listhesis.   Vertebrae: No fracture, evidence of discitis, or bone lesion. 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   Cord: Normal signal and morphology.   Posterior Fossa, vertebral arteries, paraspinal tissues: Negative.   Disc levels:   C2-C3: Minimal disc bulge. No spinal canal stenosis or neuroforaminal narrowing.   C3-C4: Minimal disc bulge. Facet and uncovertebral hypertrophy. Mild spinal canal stenosis. Mild bilateral neural foraminal narrowing.   C4-C5: Disc height loss with broad-based disc bulge. Facet arthropathy and right greater than left uncovertebral hypertrophy. Mild spinal canal stenosis. Moderate right neural foraminal narrowing.   C5-C6: Mild disc bulge. Mild spinal canal stenosis. Mild facet arthropathy. Mild neural foraminal narrowing bilaterally.   C6-C7: Status post fusion. No spinal canal stenosis or neural foraminal narrowing.   C7-T1: No significant disc bulge. Facet arthropathy. No spinal canal stenosis. Moderate left and mild right neural foraminal narrowing.   IMPRESSION: 1. Congenitally short pedicles, which narrow the AP diameter of the spinal canal; in addition to mild degenerative changes, this causes mild spinal canal stenosis at C3-C4, C4-C5, and C5-C6. 2. Multilevel uncovertebral and facet arthropathy, which causes moderate neural foraminal narrowing on the right at C4-C5 and on the left at C7-T1. There is also mild neural foraminal narrowing bilaterally at C3-C4 and C5-C6, as well as on the right C7-T1.     Electronically Signed   By: Wiliam Ke M.D.   On: 06/20/2021 15:58   She reports that she has never smoked. She has never used smokeless tobacco. No results for input(s): HGBA1C, LABURIC in the last 8760 hours.  Objective:  VS:  HT:    WT:   BMI:     BP:123/79  HR:87bpm  TEMP: ( )  RESP:  Physical Exam Vitals and nursing note reviewed.  Constitutional:      General: She  is not in acute distress.    Appearance: Normal appearance. She is well-developed. She is obese. She is not ill-appearing.  HENT:     Head: Normocephalic and atraumatic.  Eyes:     Conjunctiva/sclera: Conjunctivae normal.     Pupils: Pupils are equal, round, and reactive to light.  Cardiovascular:     Rate and Rhythm: Normal rate.     Pulses: Normal pulses.  Pulmonary:     Effort: Pulmonary effort is normal.  Musculoskeletal:        General: Tenderness present.     Cervical back: Neck supple. Tenderness present.     Right lower leg: No edema.     Left lower leg: No edema.     Comments: Patient goes from sit to stand with some difficulty with pain with facet loading of the lumbar spine.  She has pain to palpation along the paraspinal region from the cervical spine through the dorsum of the thoracic spine with taut bands that do  reproduce her pain.  No pain over the vertebral bodies.  She has some pain in ranges of cervical range of motion.  Pain with extension of the cervical spine.  She has good strength in the hands bilaterally.  She has multiple tender points.  She has no pain with hip rotation she has good distal strength without clonus.  Skin:    General: Skin is warm and dry.     Findings: No erythema or rash.  Neurological:     General: No focal deficit present.     Mental Status: She is alert and oriented to person, place, and time.     Sensory: No sensory deficit.     Motor: No abnormal muscle tone.     Coordination: Coordination normal.     Gait: Gait normal.  Psychiatric:        Mood and Affect: Mood normal.        Behavior: Behavior normal.    Ortho Exam  Imaging: No results found.  Past Medical/Family/Surgical/Social History: Medications & Allergies reviewed per EMR, new medications updated. Patient Active Problem List   Diagnosis Date Noted   Migraine 06/25/2016   Past Medical History:  Diagnosis Date   Arthritis    Migraine    Family History  Problem  Relation Age of Onset   Rheum arthritis Mother    Hypertension Mother    Healthy Father    History reviewed. No pertinent surgical history. Social History   Occupational History   Not on file  Tobacco Use   Smoking status: Never   Smokeless tobacco: Never  Vaping Use   Vaping Use: Never used  Substance and Sexual Activity   Alcohol use: Yes   Drug use: Never   Sexual activity: Not on file

## 2021-07-04 NOTE — Telephone Encounter (Signed)
Patient called. Returning a call to schedule with Dr. Newton.  ?

## 2021-07-05 NOTE — Telephone Encounter (Signed)
See previous message

## 2021-07-05 NOTE — Telephone Encounter (Signed)
Left message #2

## 2021-07-16 ENCOUNTER — Other Ambulatory Visit: Payer: Self-pay

## 2021-07-16 ENCOUNTER — Ambulatory Visit: Payer: Medicare Other | Admitting: Physical Medicine and Rehabilitation

## 2021-07-18 ENCOUNTER — Encounter: Payer: Self-pay | Admitting: Physical Medicine and Rehabilitation

## 2021-07-18 ENCOUNTER — Other Ambulatory Visit: Payer: Self-pay

## 2021-07-18 ENCOUNTER — Ambulatory Visit: Payer: Medicare Other | Admitting: Physical Medicine and Rehabilitation

## 2021-07-18 VITALS — BP 171/113 | HR 98

## 2021-07-18 DIAGNOSIS — M542 Cervicalgia: Secondary | ICD-10-CM | POA: Diagnosis not present

## 2021-07-18 DIAGNOSIS — M7918 Myalgia, other site: Secondary | ICD-10-CM | POA: Diagnosis not present

## 2021-07-18 DIAGNOSIS — Z981 Arthrodesis status: Secondary | ICD-10-CM

## 2021-07-18 DIAGNOSIS — M5412 Radiculopathy, cervical region: Secondary | ICD-10-CM

## 2021-07-18 NOTE — Progress Notes (Signed)
Pt state neck pain that travels to her right shoulder and down her arm to her pinky finger. Pt state her finger tips or numb. Pt state bending over or sitting makes the pain worse. Pt state washes dish makes the pain worse.Pt state bending over cause headaches and dizziness. Pt state laying down cause some pain. Pt state she takes over the counter pain meds to help ease her pain.  Numeric Pain Rating Scale and Functional Assessment Average Pain 10 Pain Right Now 10 My pain is intermittent, sharp, burning, dull, stabbing, tingling, and aching Pain is worse with: bending, sitting, standing, and some activites Pain improves with: medication   In the last MONTH (on 0-10 scale) has pain interfered with the following?  1. General activity like being  able to carry out your everyday physical activities such as walking, climbing stairs, carrying groceries, or moving a chair?  Rating(7)  2. Relation with others like being able to carry out your usual social activities and roles such as  activities at home, at work and in your community. Rating(8)  3. Enjoyment of life such that you have  been bothered by emotional problems such as feeling anxious, depressed or irritable?  Rating(9)

## 2021-07-18 NOTE — Progress Notes (Signed)
Aimee Bruce - 60 y.o. female MRN [id number]  Date of birth: Oct 08, 1961  Office Visit Note: Visit Date: 07/18/2021 PCP: Patient, No Pcp Per (Inactive) Referred by: No ref. provider found  Subjective: Chief Complaint  Patient presents with   Neck - Pain   Right Shoulder - Pain   Right Arm - Pain   Right Hand - Pain, Numbness   HPI: Aimee Bruce is a 60 y.o. female who comes in today For evaluation of chronic, worsening and severe bilateral neck pain radiating to both arms and hands, right greater than left. Sign language interpreter present during this encounter. Patient reports pain has been chronic for several months. Patient reports pain is exacerbated by activity and movement. Patient describes pain as shooting and sharp in nature, currently rates as 7 out of 10. Patient's recent cervical MRI exhibits ACDF at C6-C7, mild spinal canal stenosis at C3-C4, C4-C5, and C5-C6. Multi-level facet arthropathy and multi-level mild foraminal narrowing noted. No high grade canal stenosis. Patient reports history of cervical epidural injection many years ago that she reports did help to alleviate pain. Patient is currently being treated at [facility], voices that she is unhappy with her care and does not feel like she is getting any medications to help with pain relief from this practice. Patient states that she did voice to her primary care provider Bradly Chris, NP that she would like a referral to a new pain management specialist. Patient denies focal weakness, numbness and tingling. Patient denies recent trauma or falls.   Review of Systems  Musculoskeletal:  Positive for myalgias and neck pain.  Neurological:  Negative for tingling, sensory change, focal weakness and weakness.  All other systems reviewed and are negative. Otherwise per HPI.  Assessment & Plan: Visit Diagnoses:    ICD-10-CM   1. Radiculopathy, cervical region  M54.12 Ambulatory referral to Physical  Medicine Rehab    2. Cervicalgia  M54.2     3. S/P cervical spinal fusion  Z98.1     4. Myofascial pain syndrome  M79.18        Plan: Findings:  Chronic, worsening and severe bilateral neck pain radiating to both arms and hands, right greater than left. Excruciating pain despite good conservative therapies. Patient's clinical presentation and exam are consist with radicular pain, however we also believe there is a myofascial/fibromyalgia component. We did review patient's recent cervical MRI in detail today using images and spine model. Patient verbalizes good pain relief with cervical epidural injection in the past. We believe the next step is to perform diagnostic and hopefully therapeutic right C7-T1 interlaminar epidural steroid injection. Patient is not currently taking anticoagulants. If patient does well with epidural steroid injection we will continue to monitor, however if she continues to have pain we would consider a regimen of formal physical therapy. Patient instructed to follow-up with primary care provider regarding referral to new pain management specialist and to see Dr. Greig Castilla [facility] as needed. We feel that we can get patient in quickly for diagnostic cervical epidural injection. She is agreeable with plan and has no questions at this time. No red flag symptoms noted upon exam today.    Meds & Orders: No orders of the defined types were placed in this encounter.   Orders Placed This Encounter  Procedures   Ambulatory referral to Physical Medicine Rehab    Follow-up: Return in about 1 week (around 07/25/2021) for Right C7-T1 interlaminar epidural steroid injection.   Procedures: No  procedures performed      Clinical History: MRI CERVICAL SPINE WITHOUT CONTRAST   TECHNIQUE: Multiplanar, multisequence MR imaging of the cervical spine was performed. No intravenous contrast was administered.   COMPARISON:  No prior MRI, correlation is made with CT cervical spine  04/20/2021   FINDINGS: Status post C6-C7 ACDF, with osseous fusion across the disc space.   Alignment: Straightening of the normal cervical lordosis. No significant listhesis.   Vertebrae: No fracture, evidence of discitis, or bone lesion. 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   Cord: Normal signal and morphology.   Posterior Fossa, vertebral arteries, paraspinal tissues: Negative.   Disc levels:   C2-C3: Minimal disc bulge. No spinal canal stenosis or neuroforaminal narrowing.   C3-C4: Minimal disc bulge. Facet and uncovertebral hypertrophy. Mild spinal canal stenosis. Mild bilateral neural foraminal narrowing.   C4-C5: Disc height loss with broad-based disc bulge. Facet arthropathy and right greater than left uncovertebral hypertrophy. Mild spinal canal stenosis. Moderate right neural foraminal narrowing.   C5-C6: Mild disc bulge. Mild spinal canal stenosis. Mild facet arthropathy. Mild neural foraminal narrowing bilaterally.   C6-C7: Status post fusion. No spinal canal stenosis or neural foraminal narrowing.   C7-T1: No significant disc bulge. Facet arthropathy. No spinal canal stenosis. Moderate left and mild right neural foraminal narrowing.   IMPRESSION: 1. Congenitally short pedicles, which narrow the AP diameter of the spinal canal; in addition to mild degenerative changes, this causes mild spinal canal stenosis at C3-C4, C4-C5, and C5-C6. 2. Multilevel uncovertebral and facet arthropathy, which causes moderate neural foraminal narrowing on the right at C4-C5 and on the left at C7-T1. There is also mild neural foraminal narrowing bilaterally at C3-C4 and C5-C6, as well as on the right C7-T1.     Electronically Signed   By: Wiliam Ke M.D.   On: 06/20/2021 15:58   She reports that she has never smoked. She has never used smokeless tobacco. No results for input(s): HGBA1C, LABURIC in the last 8760  hours.  Objective:  VS:  HT:    WT:   BMI:     BP:(!) 171/113  HR:98bpm  TEMP: ( )  RESP:  Physical Exam HENT:     Head: Normocephalic and atraumatic.     Right Ear: Tympanic membrane normal.     Left Ear: Tympanic membrane normal.     Nose: Nose normal.     Mouth/Throat:     Mouth: Mucous membranes are moist.  Eyes:     Pupils: Pupils are equal, round, and reactive to light.  Cardiovascular:     Rate and Rhythm: Normal rate.     Pulses: Normal pulses.  Pulmonary:     Effort: Pulmonary effort is normal.  Abdominal:     General: Abdomen is flat. There is no distension.  Musculoskeletal:        General: Tenderness present.     Cervical back: Tenderness present.     Comments: Discomfort noted with flexion, extension and side-to-side rotation. Good strength noted to bilateral upper extremities. Sensation intact bilaterally. Tenderness noted to trigger points noted to bilateral rhomboid muscles. Negative Hoffman's sign.    Skin:    General: Skin is warm and dry.  Neurological:     General: No focal deficit present.     Mental Status: She is alert.  Psychiatric:        Mood and Affect: Mood normal.    Ortho Exam  Imaging: No results found.  Past Medical/Family/Surgical/Social History: Medications & Allergies reviewed per EMR, new medications updated. Patient Active Problem List   Diagnosis Date Noted   Migraine 06/25/2016   Past Medical History:  Diagnosis Date   Arthritis    Migraine    Family History  Problem Relation Age of Onset   Rheum arthritis Mother    Hypertension Mother    Healthy Father    History reviewed. No pertinent surgical history. Social History   Occupational History   Not on file  Tobacco Use   Smoking status: Never   Smokeless tobacco: Never  Vaping Use   Vaping Use: Never used  Substance and Sexual Activity   Alcohol use: Yes   Drug use: Never   Sexual activity: Not on file

## 2021-07-26 ENCOUNTER — Encounter: Payer: Self-pay | Admitting: Physical Medicine and Rehabilitation

## 2021-07-26 ENCOUNTER — Ambulatory Visit (INDEPENDENT_AMBULATORY_CARE_PROVIDER_SITE_OTHER): Payer: Medicare Other | Admitting: Physical Medicine and Rehabilitation

## 2021-07-26 ENCOUNTER — Ambulatory Visit: Payer: Self-pay

## 2021-07-26 ENCOUNTER — Other Ambulatory Visit: Payer: Self-pay

## 2021-07-26 VITALS — BP 152/95 | HR 105

## 2021-07-26 DIAGNOSIS — M5412 Radiculopathy, cervical region: Secondary | ICD-10-CM

## 2021-07-26 MED ORDER — BETAMETHASONE SOD PHOS & ACET 6 (3-3) MG/ML IJ SUSP
12.0000 mg | Freq: Once | INTRAMUSCULAR | Status: AC
  Administered 2021-07-26: 12 mg

## 2021-07-26 NOTE — Progress Notes (Signed)
Pt state neck pain that travels down both arm to her fingers. Pt state bending over and turning her head makes the pain worse. Pt state she takes over the counter pain meds to help ease her pain.  Numeric Pain Rating Scale and Functional Assessment Average Pain 8   In the last MONTH (on 0-10 scale) has pain interfered with the following?  1. General activity like being  able to carry out your everyday physical activities such as walking, climbing stairs, carrying groceries, or moving a chair?  Rating(10)   +Driver, -BT, -Dye Allergies.

## 2021-07-26 NOTE — Patient Instructions (Signed)

## 2021-07-27 NOTE — Progress Notes (Signed)
Aimee Bruce - 60 y.o. female MRN [id number]  Date of birth: 1960-12-09  Office Visit Note: Visit Date: 07/26/2021 PCP: Patient, No Pcp Per (Inactive) Referred by: Juanda Chance, NP  Subjective: Chief Complaint  Patient presents with   Neck - Pain   Left Hand - Pain   Right Hand - Pain   HPI:  Aimee Bruce is a 60 y.o. female who comes in today at the request of Ellin Goodie, FNP for planned Right C7-T1 Cervical Interlaminar epidural steroid injection with fluoroscopic guidance.  The patient has failed conservative care including home exercise, medications, time and activity modification.  This injection will be diagnostic and hopefully therapeutic.  Please see requesting physician notes for further details and justification. MRI reviewed with images and spine model.  MRI reviewed in the note below.  Sign language interpreter was in the room prior to the injection and during injection.   ROS Otherwise per HPI.  Assessment & Plan: Visit Diagnoses:    ICD-10-CM   1. Radiculopathy, cervical region  M54.12 XR C-ARM NO REPORT    Epidural Steroid injection    betamethasone acetate-betamethasone sodium phosphate (CELESTONE) injection 12 mg      Plan: No additional findings.   Meds & Orders:  Meds ordered this encounter  Medications   betamethasone acetate-betamethasone sodium phosphate (CELESTONE) injection 12 mg    Orders Placed This Encounter  Procedures   XR C-ARM NO REPORT   Epidural Steroid injection    Follow-up: Return if symptoms worsen or fail to improve.   Procedures: No procedures performed  Cervical Epidural Steroid Injection - Interlaminar Approach with Fluoroscopic Guidance  Patient: Aimee Bruce      Date of Birth: 1961-01-20 MRN: [id number] PCP: Patient, No Pcp Per (Inactive)      Visit Date: 07/26/2021   Universal Protocol:    Date/Time: 09/30/226:44 PM  Consent Given By: the patient  Position: PRONE  Additional Comments: Vital signs were monitored  before and after the procedure. Patient was prepped and draped in the usual sterile fashion. The correct patient, procedure, and site was verified.   Injection Procedure Details:   Procedure diagnoses: Radiculopathy, cervical region [M54.12]    Meds Administered:  Meds ordered this encounter  Medications   betamethasone acetate-betamethasone sodium phosphate (CELESTONE) injection 12 mg     Laterality: Right  Location/Site: C7-T1  Needle: 3.5 in., 20 ga. Tuohy  Needle Placement: Paramedian epidural space  Findings:  -Comments: Excellent flow of contrast into the epidural space.  Procedure Details: Using a paramedian approach from the side mentioned above, the region overlying the inferior lamina was localized under fluoroscopic visualization and the soft tissues overlying this structure were infiltrated with 4 ml. of 1% Lidocaine without Epinephrine. A # 20 gauge, Tuohy needle was inserted into the epidural space using a paramedian approach.  The epidural space was localized using loss of resistance along with contralateral oblique bi-planar fluoroscopic views.  After negative aspirate for air, blood, and CSF, a 2 ml. volume of Isovue-250 was injected into the epidural space and the flow of contrast was observed. Radiographs were obtained for documentation purposes.   The injectate was administered into the level noted above.  Additional Comments:  The patient tolerated the procedure well Dressing: 2 x 2 sterile gauze and Band-Aid    Post-procedure details: Patient was observed during the procedure. Post-procedure instructions were reviewed.  Patient left the clinic in stable condition.   Clinical History: MRI CERVICAL SPINE WITHOUT CONTRAST   TECHNIQUE:  Multiplanar, multisequence MR imaging of the cervical spine was performed. No intravenous contrast was administered.   COMPARISON:  No prior MRI, correlation is made with CT cervical spine 04/20/2021    FINDINGS: Status post C6-C7 ACDF, with osseous fusion across the disc space.   Alignment: Straightening of the normal cervical lordosis. No significant listhesis.   Vertebrae: No fracture, evidence of discitis, or bone lesion. 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   Cord: Normal signal and morphology.   Posterior Fossa, vertebral arteries, paraspinal tissues: Negative.   Disc levels:   C2-C3: Minimal disc bulge. No spinal canal stenosis or neuroforaminal narrowing.   C3-C4: Minimal disc bulge. Facet and uncovertebral hypertrophy. Mild spinal canal stenosis. Mild bilateral neural foraminal narrowing.   C4-C5: Disc height loss with broad-based disc bulge. Facet arthropathy and right greater than left uncovertebral hypertrophy. Mild spinal canal stenosis. Moderate right neural foraminal narrowing.   C5-C6: Mild disc bulge. Mild spinal canal stenosis. Mild facet arthropathy. Mild neural foraminal narrowing bilaterally.   C6-C7: Status post fusion. No spinal canal stenosis or neural foraminal narrowing.   C7-T1: No significant disc bulge. Facet arthropathy. No spinal canal stenosis. Moderate left and mild right neural foraminal narrowing.   IMPRESSION: 1. Congenitally short pedicles, which narrow the AP diameter of the spinal canal; in addition to mild degenerative changes, this causes mild spinal canal stenosis at C3-C4, C4-C5, and C5-C6. 2. Multilevel uncovertebral and facet arthropathy, which causes moderate neural foraminal narrowing on the right at C4-C5 and on the left at C7-T1. There is also mild neural foraminal narrowing bilaterally at C3-C4 and C5-C6, as well as on the right C7-T1.     Electronically Signed   By: Wiliam Ke M.D.   On: 06/20/2021 15:58     Objective:  VS:  HT:    WT:   BMI:     BP:(!) 152/95  HR:(!) 105bpm  TEMP: ( )  RESP:  Physical Exam Vitals and nursing note reviewed.   Constitutional:      General: She is not in acute distress.    Appearance: Normal appearance. She is not ill-appearing.  HENT:     Head: Normocephalic and atraumatic.     Right Ear: External ear normal.     Left Ear: External ear normal.  Eyes:     Extraocular Movements: Extraocular movements intact.  Cardiovascular:     Rate and Rhythm: Normal rate.     Pulses: Normal pulses.  Musculoskeletal:     Cervical back: Tenderness present. No rigidity.     Right lower leg: No edema.     Left lower leg: No edema.     Comments: Patient sits with forward flexed cervical spine.  She has some limitation in range of motion bilaterally.  Has no strength deficits and no differences from prior exam.   Lymphadenopathy:     Cervical: No cervical adenopathy.  Skin:    Findings: No erythema, lesion or rash.  Neurological:     General: No focal deficit present.     Mental Status: She is alert and oriented to person, place, and time.     Sensory: No sensory deficit.     Motor: No weakness or abnormal muscle tone.     Coordination: Coordination normal.  Psychiatric:        Mood and Affect: Mood normal.        Behavior: Behavior normal.     Imaging: No results found.

## 2021-07-27 NOTE — Procedures (Signed)
Cervical Epidural Steroid Injection - Interlaminar Approach with Fluoroscopic Guidance  Patient: Aimee Bruce      Date of Birth: 1960-12-07 MRN: [id number] PCP: Patient, No Pcp Per (Inactive)      Visit Date: 07/26/2021   Universal Protocol:    Date/Time: 09/30/226:44 PM  Consent Given By: the patient  Position: PRONE  Additional Comments: Vital signs were monitored before and after the procedure. Patient was prepped and draped in the usual sterile fashion. The correct patient, procedure, and site was verified.   Injection Procedure Details:   Procedure diagnoses: Radiculopathy, cervical region [M54.12]    Meds Administered:  Meds ordered this encounter  Medications   betamethasone acetate-betamethasone sodium phosphate (CELESTONE) injection 12 mg     Laterality: Right  Location/Site: C7-T1  Needle: 3.5 in., 20 ga. Tuohy  Needle Placement: Paramedian epidural space  Findings:  -Comments: Excellent flow of contrast into the epidural space.  Procedure Details: Using a paramedian approach from the side mentioned above, the region overlying the inferior lamina was localized under fluoroscopic visualization and the soft tissues overlying this structure were infiltrated with 4 ml. of 1% Lidocaine without Epinephrine. A # 20 gauge, Tuohy needle was inserted into the epidural space using a paramedian approach.  The epidural space was localized using loss of resistance along with contralateral oblique bi-planar fluoroscopic views.  After negative aspirate for air, blood, and CSF, a 2 ml. volume of Isovue-250 was injected into the epidural space and the flow of contrast was observed. Radiographs were obtained for documentation purposes.   The injectate was administered into the level noted above.  Additional Comments:  The patient tolerated the procedure well Dressing: 2 x 2 sterile gauze and Band-Aid    Post-procedure details: Patient was observed during the  procedure. Post-procedure instructions were reviewed.  Patient left the clinic in stable condition.

## 2021-08-17 ENCOUNTER — Encounter: Payer: Self-pay | Admitting: Physical Medicine & Rehabilitation

## 2021-08-17 ENCOUNTER — Other Ambulatory Visit: Payer: Self-pay

## 2021-08-17 ENCOUNTER — Encounter: Payer: Medicare Other | Attending: Physical Medicine & Rehabilitation | Admitting: Physical Medicine & Rehabilitation

## 2021-08-17 ENCOUNTER — Other Ambulatory Visit: Payer: Self-pay | Admitting: Physical Medicine & Rehabilitation

## 2021-08-17 VITALS — BP 136/84 | HR 97 | Temp 98.5°F | Ht 67.0 in | Wt 230.0 lb

## 2021-08-17 DIAGNOSIS — M533 Sacrococcygeal disorders, not elsewhere classified: Secondary | ICD-10-CM

## 2021-08-17 NOTE — Patient Instructions (Signed)
Sacroiliac injection was performed today. A combination of numbing medicine (lidocaine) plus a cortisone medicine (betamethasone) was injected. The injection was done under x-ray guidance. This procedure has been performed to help reduce low back and buttocks pain as well as potentially hip pain. The duration of this injection is variable lasting from hours to  Months. It may repeated if needed. 

## 2021-08-17 NOTE — Progress Notes (Signed)
60 year old female with low back and buttock pain.  No radicular component.  Patient scheduled for bilateral sacroiliac injections under fluoroscopic guidance.  The patient states she feels nervous about this injection because she did not have a MRI.  We discussed that this is a joint injection and not into the spinal canal.  Nevertheless she wishes to forego the injection. The patient can follow-up with Dr. Prince Rome and/or Dr. Alvester Morin

## 2021-10-24 ENCOUNTER — Other Ambulatory Visit: Payer: Self-pay | Admitting: Physical Medicine & Rehabilitation

## 2021-12-26 ENCOUNTER — Other Ambulatory Visit: Payer: Self-pay | Admitting: Physical Medicine & Rehabilitation
